# Patient Record
Sex: Male | Born: 1935
Health system: Southern US, Community
[De-identification: ages and names within clinical notes are randomized; demographics above are authoritative.]

## PROBLEM LIST (undated history)

## (undated) DIAGNOSIS — I10 Essential (primary) hypertension: Secondary | ICD-10-CM

## (undated) DIAGNOSIS — K5790 Diverticulosis of intestine, part unspecified, without perforation or abscess without bleeding: Secondary | ICD-10-CM

## (undated) DIAGNOSIS — K219 Gastro-esophageal reflux disease without esophagitis: Secondary | ICD-10-CM

## (undated) DIAGNOSIS — E785 Hyperlipidemia, unspecified: Secondary | ICD-10-CM

## (undated) DIAGNOSIS — K635 Polyp of colon: Secondary | ICD-10-CM

## (undated) DIAGNOSIS — M549 Dorsalgia, unspecified: Secondary | ICD-10-CM

## (undated) DIAGNOSIS — G8929 Other chronic pain: Secondary | ICD-10-CM

## (undated) DIAGNOSIS — R079 Chest pain, unspecified: Secondary | ICD-10-CM

## (undated) DIAGNOSIS — IMO0002 Reserved for concepts with insufficient information to code with codable children: Secondary | ICD-10-CM

## (undated) DIAGNOSIS — N529 Male erectile dysfunction, unspecified: Secondary | ICD-10-CM

## (undated) HISTORY — DX: Essential (primary) hypertension: I10

## (undated) HISTORY — DX: Chest pain, unspecified: R07.9

## (undated) HISTORY — DX: Dorsalgia, unspecified: M54.9

## (undated) HISTORY — DX: Hyperlipidemia, unspecified: E78.5

## (undated) HISTORY — DX: Male erectile dysfunction, unspecified: N52.9

## (undated) HISTORY — DX: Reserved for concepts with insufficient information to code with codable children: IMO0002

## (undated) HISTORY — DX: Polyp of colon: K63.5

## (undated) HISTORY — DX: Diverticulosis of intestine, part unspecified, without perforation or abscess without bleeding: K57.90

## (undated) HISTORY — DX: Gastro-esophageal reflux disease without esophagitis: K21.9

## (undated) HISTORY — DX: Other chronic pain: G89.29

---

## 1998-01-20 ENCOUNTER — Ambulatory Visit (HOSPITAL_COMMUNITY): Admission: RE | Admit: 1998-01-20 | Discharge: 1998-01-20 | Payer: Self-pay | Admitting: Internal Medicine

## 2002-01-27 ENCOUNTER — Encounter: Payer: Self-pay | Admitting: *Deleted

## 2002-01-27 ENCOUNTER — Emergency Department (HOSPITAL_COMMUNITY): Admission: EM | Admit: 2002-01-27 | Discharge: 2002-01-27 | Payer: Self-pay | Admitting: *Deleted

## 2002-04-02 ENCOUNTER — Ambulatory Visit (HOSPITAL_COMMUNITY): Admission: RE | Admit: 2002-04-02 | Discharge: 2002-04-02 | Payer: Self-pay | Admitting: Gastroenterology

## 2002-05-14 ENCOUNTER — Encounter: Payer: Self-pay | Admitting: Gastroenterology

## 2002-05-14 ENCOUNTER — Ambulatory Visit (HOSPITAL_COMMUNITY): Admission: RE | Admit: 2002-05-14 | Discharge: 2002-05-14 | Payer: Self-pay | Admitting: Gastroenterology

## 2012-02-21 DIAGNOSIS — I10 Essential (primary) hypertension: Secondary | ICD-10-CM | POA: Diagnosis not present

## 2012-02-21 DIAGNOSIS — R011 Cardiac murmur, unspecified: Secondary | ICD-10-CM | POA: Diagnosis not present

## 2012-05-16 ENCOUNTER — Other Ambulatory Visit: Payer: Self-pay | Admitting: Gastroenterology

## 2012-05-16 DIAGNOSIS — D126 Benign neoplasm of colon, unspecified: Secondary | ICD-10-CM | POA: Diagnosis not present

## 2012-07-17 DIAGNOSIS — Z23 Encounter for immunization: Secondary | ICD-10-CM | POA: Diagnosis not present

## 2012-08-21 DIAGNOSIS — Z1331 Encounter for screening for depression: Secondary | ICD-10-CM | POA: Diagnosis not present

## 2012-08-21 DIAGNOSIS — E785 Hyperlipidemia, unspecified: Secondary | ICD-10-CM | POA: Diagnosis not present

## 2012-08-21 DIAGNOSIS — I10 Essential (primary) hypertension: Secondary | ICD-10-CM | POA: Diagnosis not present

## 2012-08-21 DIAGNOSIS — Z0000 Encounter for general adult medical examination without abnormal findings: Secondary | ICD-10-CM | POA: Diagnosis not present

## 2013-02-19 DIAGNOSIS — I10 Essential (primary) hypertension: Secondary | ICD-10-CM | POA: Diagnosis not present

## 2013-07-10 DIAGNOSIS — Z23 Encounter for immunization: Secondary | ICD-10-CM | POA: Diagnosis not present

## 2013-09-03 DIAGNOSIS — Z0000 Encounter for general adult medical examination without abnormal findings: Secondary | ICD-10-CM | POA: Diagnosis not present

## 2013-09-03 DIAGNOSIS — E785 Hyperlipidemia, unspecified: Secondary | ICD-10-CM | POA: Diagnosis not present

## 2013-09-03 DIAGNOSIS — Z1331 Encounter for screening for depression: Secondary | ICD-10-CM | POA: Diagnosis not present

## 2013-09-03 DIAGNOSIS — I10 Essential (primary) hypertension: Secondary | ICD-10-CM | POA: Diagnosis not present

## 2014-03-04 DIAGNOSIS — N529 Male erectile dysfunction, unspecified: Secondary | ICD-10-CM | POA: Diagnosis not present

## 2014-03-04 DIAGNOSIS — Z23 Encounter for immunization: Secondary | ICD-10-CM | POA: Diagnosis not present

## 2014-03-04 DIAGNOSIS — I10 Essential (primary) hypertension: Secondary | ICD-10-CM | POA: Diagnosis not present

## 2014-07-03 DIAGNOSIS — M25559 Pain in unspecified hip: Secondary | ICD-10-CM | POA: Diagnosis not present

## 2014-07-03 DIAGNOSIS — Z23 Encounter for immunization: Secondary | ICD-10-CM | POA: Diagnosis not present

## 2014-07-08 ENCOUNTER — Other Ambulatory Visit: Payer: Self-pay | Admitting: Internal Medicine

## 2014-07-08 ENCOUNTER — Ambulatory Visit
Admission: RE | Admit: 2014-07-08 | Discharge: 2014-07-08 | Disposition: A | Discharge status: Home / Self Care | Payer: Medicare Other | Source: Ambulatory Visit | Attending: Internal Medicine | Admitting: Internal Medicine

## 2014-07-08 DIAGNOSIS — M25551 Pain in right hip: Secondary | ICD-10-CM

## 2014-07-08 DIAGNOSIS — M25559 Pain in unspecified hip: Secondary | ICD-10-CM | POA: Diagnosis not present

## 2014-07-10 DIAGNOSIS — M25559 Pain in unspecified hip: Secondary | ICD-10-CM | POA: Diagnosis not present

## 2014-07-15 DIAGNOSIS — M25559 Pain in unspecified hip: Secondary | ICD-10-CM | POA: Diagnosis not present

## 2014-07-17 DIAGNOSIS — M545 Low back pain, unspecified: Secondary | ICD-10-CM | POA: Diagnosis not present

## 2014-07-19 DIAGNOSIS — M545 Low back pain: Secondary | ICD-10-CM | POA: Diagnosis not present

## 2014-07-24 DIAGNOSIS — M545 Low back pain: Secondary | ICD-10-CM | POA: Diagnosis not present

## 2014-07-24 DIAGNOSIS — M5126 Other intervertebral disc displacement, lumbar region: Secondary | ICD-10-CM | POA: Diagnosis not present

## 2014-07-24 DIAGNOSIS — M5416 Radiculopathy, lumbar region: Secondary | ICD-10-CM | POA: Diagnosis not present

## 2014-08-08 DIAGNOSIS — M5126 Other intervertebral disc displacement, lumbar region: Secondary | ICD-10-CM | POA: Diagnosis not present

## 2014-08-08 DIAGNOSIS — M5416 Radiculopathy, lumbar region: Secondary | ICD-10-CM | POA: Diagnosis not present

## 2014-08-08 DIAGNOSIS — I10 Essential (primary) hypertension: Secondary | ICD-10-CM | POA: Diagnosis not present

## 2014-08-12 DIAGNOSIS — M5116 Intervertebral disc disorders with radiculopathy, lumbar region: Secondary | ICD-10-CM | POA: Diagnosis not present

## 2014-08-12 DIAGNOSIS — M5126 Other intervertebral disc displacement, lumbar region: Secondary | ICD-10-CM | POA: Diagnosis not present

## 2014-09-23 DIAGNOSIS — E538 Deficiency of other specified B group vitamins: Secondary | ICD-10-CM | POA: Diagnosis not present

## 2014-09-23 DIAGNOSIS — Z0000 Encounter for general adult medical examination without abnormal findings: Secondary | ICD-10-CM | POA: Diagnosis not present

## 2014-09-23 DIAGNOSIS — I10 Essential (primary) hypertension: Secondary | ICD-10-CM | POA: Diagnosis not present

## 2014-09-23 DIAGNOSIS — Z1389 Encounter for screening for other disorder: Secondary | ICD-10-CM | POA: Diagnosis not present

## 2014-09-23 DIAGNOSIS — E780 Pure hypercholesterolemia: Secondary | ICD-10-CM | POA: Diagnosis not present

## 2014-11-13 IMAGING — CR DG LUMBAR SPINE COMPLETE 4+V
4 series · 4 of 4 positions shown · non-contrast
Comparison: None.

CLINICAL DATA: Acute low back pain radiating into right hip. No
known injury/trauma. Remote history of low back surgery.

EXAM:
LUMBAR SPINE - COMPLETE 4+ VIEW

[t l-spine a.p.]
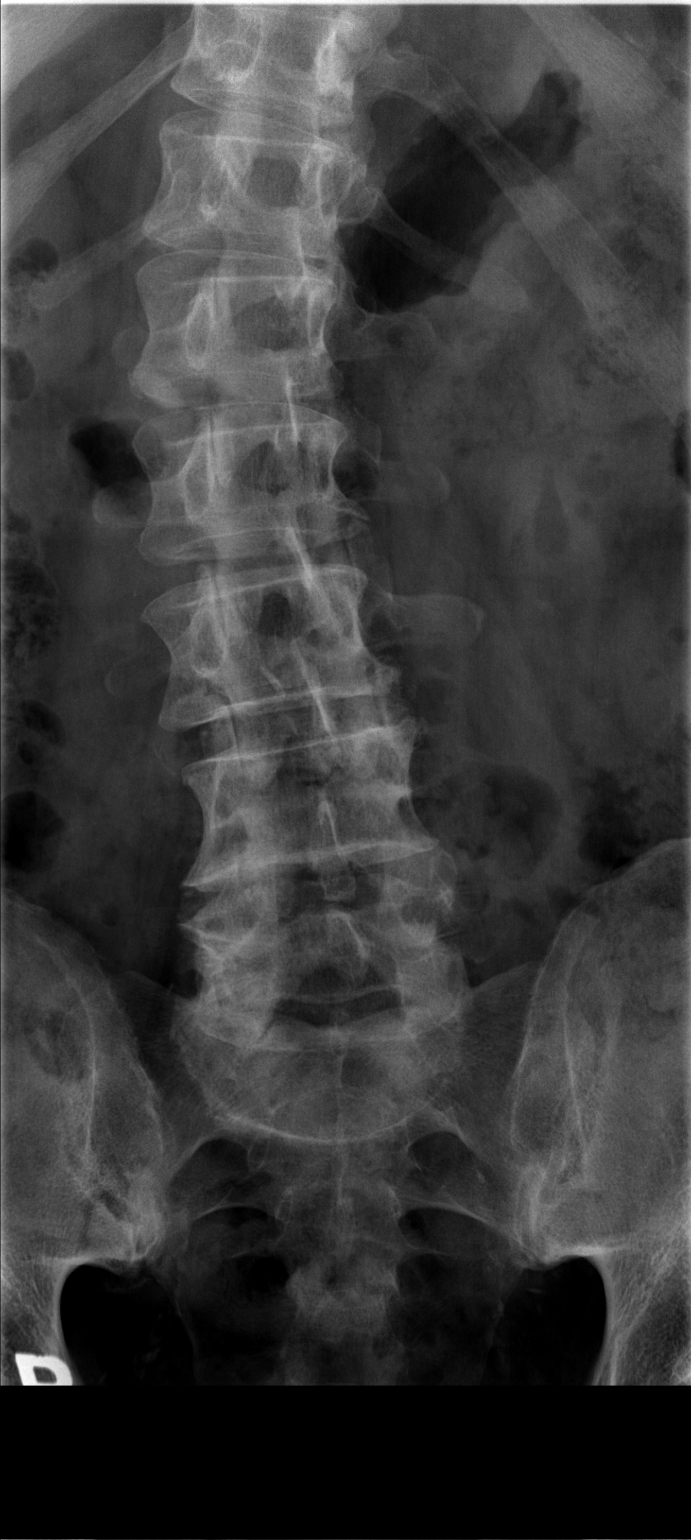

[t l-spine oblique exposure (1 of 2)]
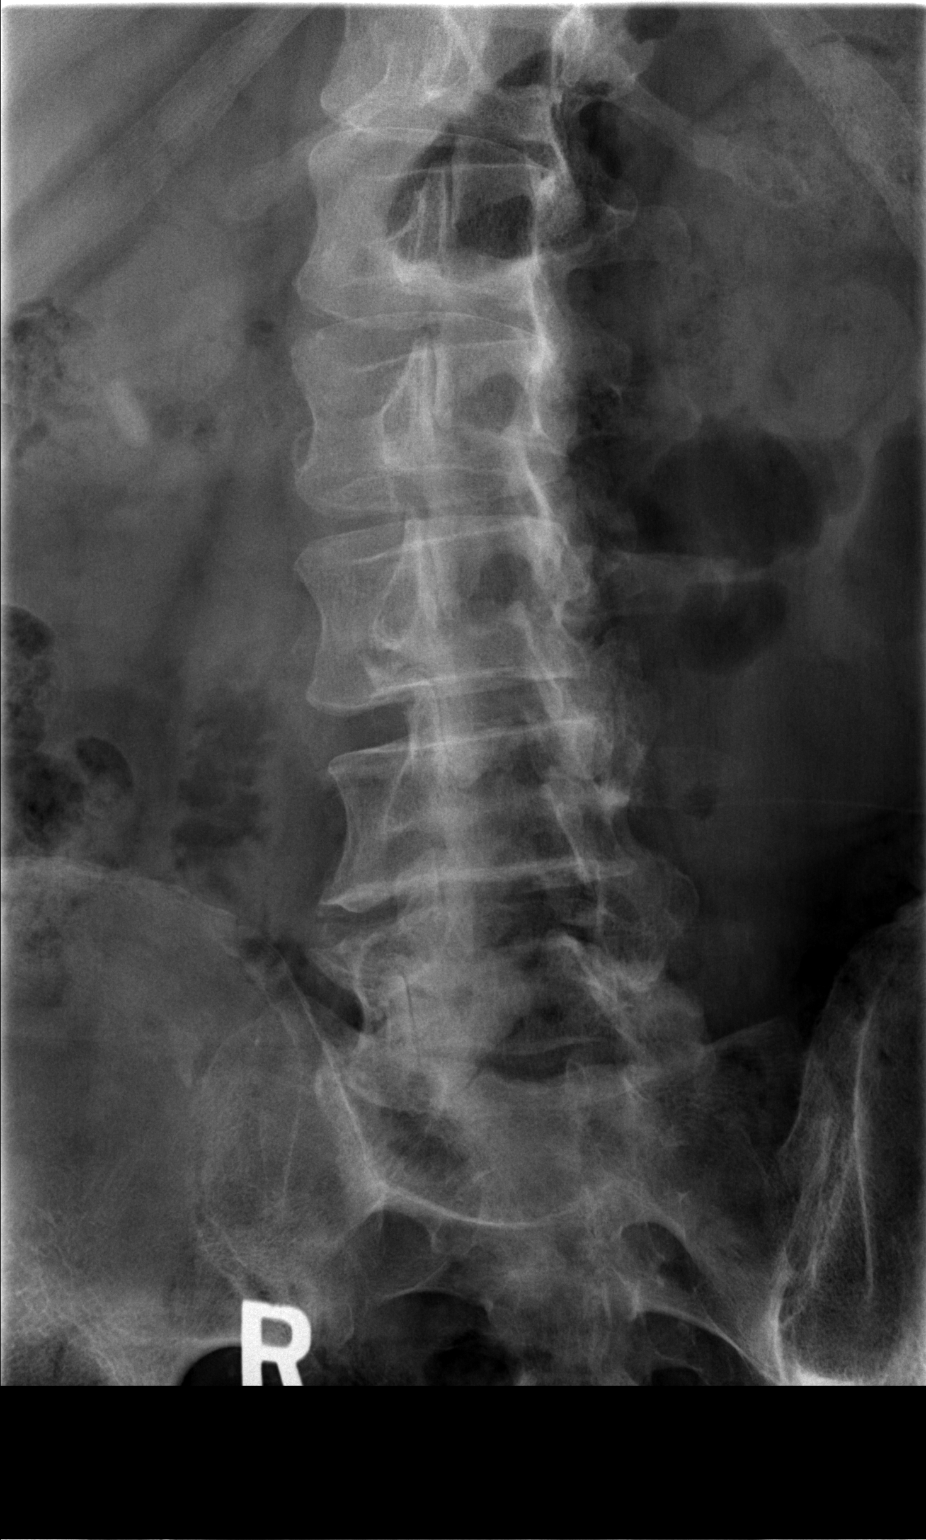

[t l-spine oblique exposure (2 of 2)]
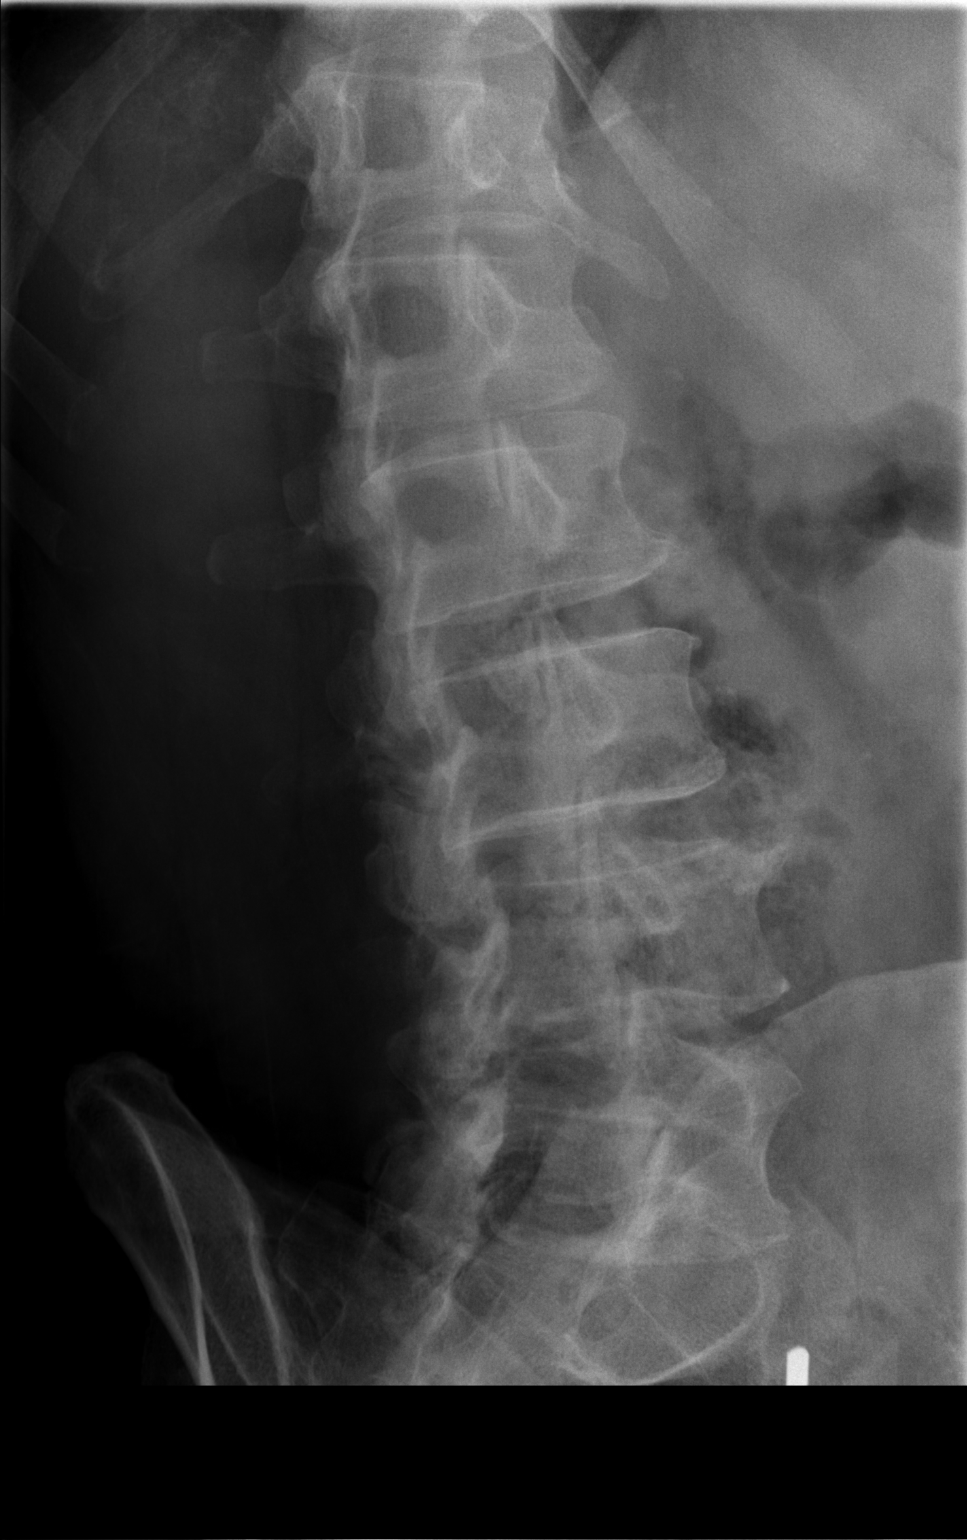

[t l-spine l5-s1 spot]
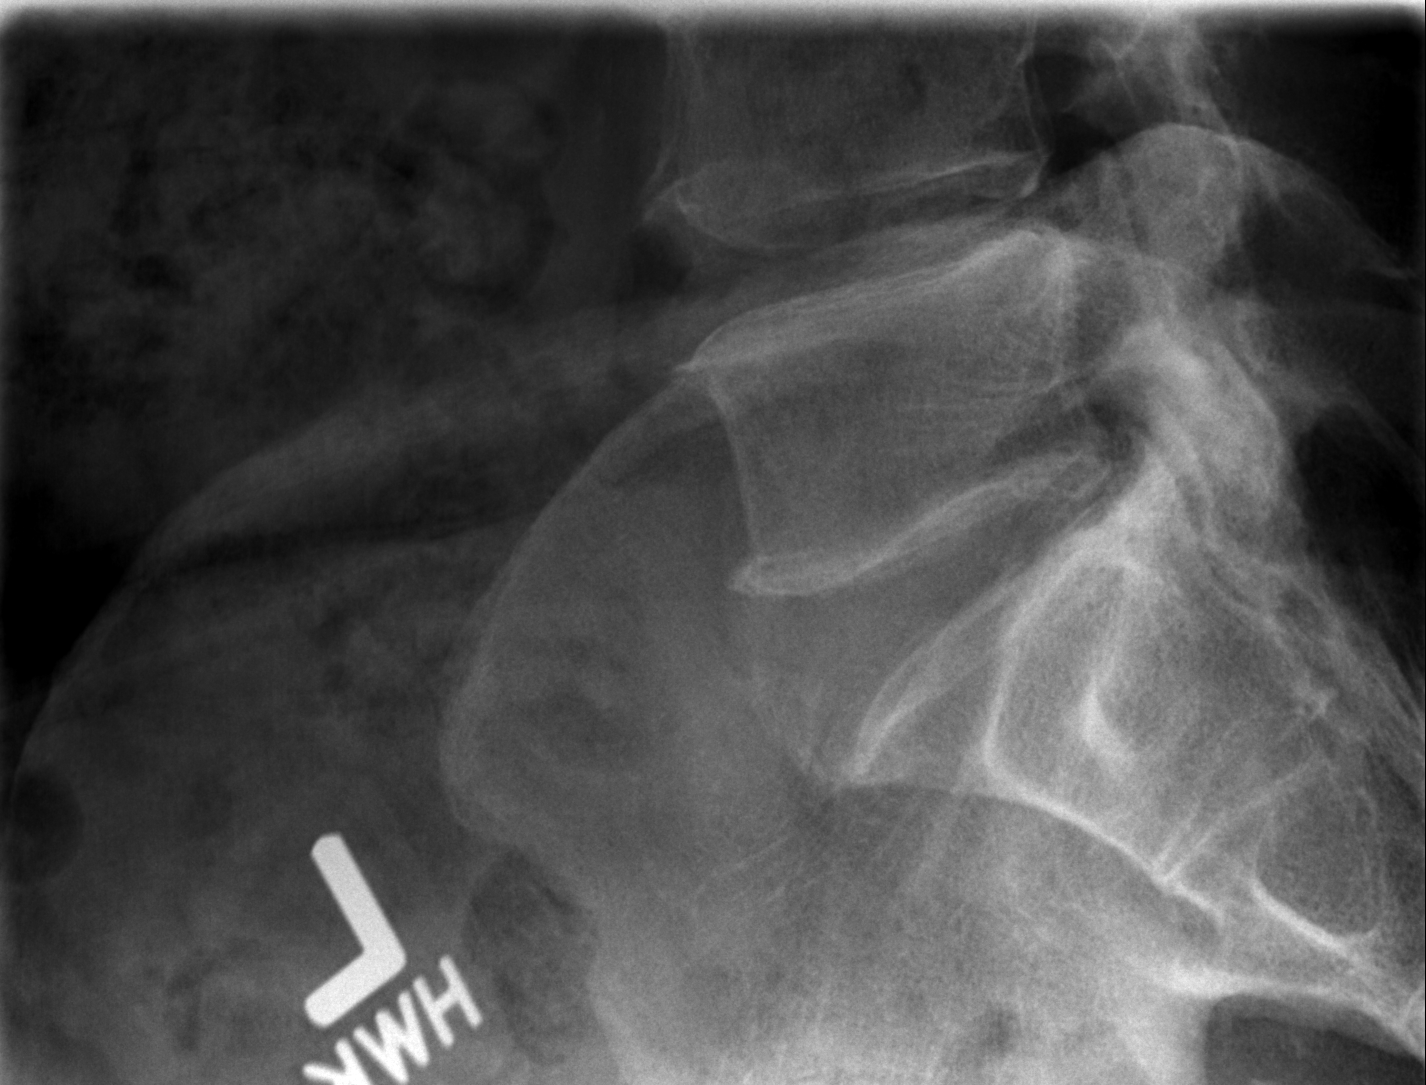

[4 of 4 positions shown; findings below may reference images not displayed]

FINDINGS: Mild rightward scoliosis in the lumbar spine. Degenerative facet
disease in the mid and lower lumbar spine. Degenerative disc disease
from L3-4 through L5-S1 with disc space narrowing and anterior
spurring. No fracture or acute subluxation. SI joints are symmetric
and unremarkable.
IMPRESSION: Rightward scoliosis.

Degenerative disc and facet disease in the mid and lower lumbar
spine.

## 2015-03-31 DIAGNOSIS — E780 Pure hypercholesterolemia: Secondary | ICD-10-CM | POA: Diagnosis not present

## 2015-03-31 DIAGNOSIS — I10 Essential (primary) hypertension: Secondary | ICD-10-CM | POA: Diagnosis not present

## 2015-07-25 DIAGNOSIS — Z23 Encounter for immunization: Secondary | ICD-10-CM | POA: Diagnosis not present

## 2015-09-24 DIAGNOSIS — R112 Nausea with vomiting, unspecified: Secondary | ICD-10-CM | POA: Diagnosis not present

## 2015-09-24 DIAGNOSIS — I10 Essential (primary) hypertension: Secondary | ICD-10-CM | POA: Diagnosis not present

## 2015-09-24 DIAGNOSIS — M6281 Muscle weakness (generalized): Secondary | ICD-10-CM | POA: Diagnosis not present

## 2015-09-24 DIAGNOSIS — R42 Dizziness and giddiness: Secondary | ICD-10-CM | POA: Diagnosis not present

## 2015-09-24 DIAGNOSIS — R001 Bradycardia, unspecified: Secondary | ICD-10-CM | POA: Diagnosis not present

## 2016-01-19 DIAGNOSIS — R351 Nocturia: Secondary | ICD-10-CM | POA: Diagnosis not present

## 2016-01-19 DIAGNOSIS — I10 Essential (primary) hypertension: Secondary | ICD-10-CM | POA: Diagnosis not present

## 2016-01-19 DIAGNOSIS — R350 Frequency of micturition: Secondary | ICD-10-CM | POA: Diagnosis not present

## 2016-03-25 ENCOUNTER — Encounter (HOSPITAL_COMMUNITY): Payer: Self-pay | Admitting: Emergency Medicine

## 2016-03-25 ENCOUNTER — Emergency Department (HOSPITAL_COMMUNITY): Payer: Medicare Other

## 2016-03-25 ENCOUNTER — Emergency Department (HOSPITAL_COMMUNITY)
Admission: EM | Admit: 2016-03-25 | Discharge: 2016-03-25 | Disposition: A | Discharge status: Home / Self Care | Payer: Medicare Other | Attending: Emergency Medicine | Admitting: Emergency Medicine

## 2016-03-25 DIAGNOSIS — R0789 Other chest pain: Secondary | ICD-10-CM | POA: Diagnosis not present

## 2016-03-25 DIAGNOSIS — I10 Essential (primary) hypertension: Secondary | ICD-10-CM | POA: Insufficient documentation

## 2016-03-25 DIAGNOSIS — Z7982 Long term (current) use of aspirin: Secondary | ICD-10-CM | POA: Insufficient documentation

## 2016-03-25 DIAGNOSIS — E785 Hyperlipidemia, unspecified: Secondary | ICD-10-CM | POA: Diagnosis not present

## 2016-03-25 DIAGNOSIS — Z87891 Personal history of nicotine dependence: Secondary | ICD-10-CM | POA: Diagnosis not present

## 2016-03-25 DIAGNOSIS — Z79899 Other long term (current) drug therapy: Secondary | ICD-10-CM | POA: Insufficient documentation

## 2016-03-25 DIAGNOSIS — R079 Chest pain, unspecified: Secondary | ICD-10-CM | POA: Diagnosis not present

## 2016-03-25 DIAGNOSIS — Z711 Person with feared health complaint in whom no diagnosis is made: Secondary | ICD-10-CM | POA: Insufficient documentation

## 2016-03-25 LAB — I-STAT TROPONIN, ED
Troponin i, poc: 0 ng/mL (ref 0.00–0.08)
Troponin i, poc: 0 ng/mL (ref 0.00–0.08)

## 2016-03-25 LAB — BASIC METABOLIC PANEL
Anion gap: 5 (ref 5–15)
BUN: 9 mg/dL (ref 6–20)
CHLORIDE: 103 mmol/L (ref 101–111)
CO2: 30 mmol/L (ref 22–32)
Calcium: 9.1 mg/dL (ref 8.9–10.3)
Creatinine, Ser: 0.78 mg/dL (ref 0.61–1.24)
GFR calc Af Amer: >60 mL/min (ref >60)
GFR calc non Af Amer: >60 mL/min (ref >60)
GLUCOSE: 107 mg/dL — AB (ref 65–99)
POTASSIUM: 3.6 mmol/L (ref 3.5–5.1)
Sodium: 138 mmol/L (ref 135–145)

## 2016-03-25 LAB — CBC
HEMATOCRIT: 35.8 % — AB (ref 39.0–52.0)
HEMOGLOBIN: 12.4 g/dL — AB (ref 13.0–17.0)
MCH: 31.5 pg (ref 26.0–34.0)
MCHC: 34.6 g/dL (ref 30.0–36.0)
MCV: 90.9 fL (ref 78.0–100.0)
Platelets: 148 10*3/uL — ABNORMAL LOW (ref 150–400)
RBC: 3.94 MIL/uL — ABNORMAL LOW (ref 4.22–5.81)
RDW: 13.6 % (ref 11.5–15.5)
WBC: 3 10*3/uL — ABNORMAL LOW (ref 4.0–10.5)

## 2016-03-25 LAB — URINALYSIS, ROUTINE W REFLEX MICROSCOPIC
Bilirubin Urine: NEGATIVE
GLUCOSE, UA: NEGATIVE mg/dL
HGB URINE DIPSTICK: NEGATIVE
Ketones, ur: NEGATIVE mg/dL
LEUKOCYTES UA: NEGATIVE
Nitrite: NEGATIVE
Protein, ur: NEGATIVE mg/dL
SPECIFIC GRAVITY, URINE: 1.011 (ref 1.005–1.030)
pH: 6.5 (ref 5.0–8.0)

## 2016-03-25 NOTE — Discharge Instructions (Signed)
You were seen in the ED for an episode of feeling abnormal that happened at rest last night.  Your cardiac labs, urine, chest xray are normal.  I do not feel there is any life threatening process is present. It is not clear what caused you to have this feeling for several seconds last night.  I recommend close follow up with your PCP.   If you have chest pain, palpitations, shortness of breath, dizziness, numbness or weakness on one side of your body, severe headache, difficulty with speech or slurred speech, please return to the ED.

## 2016-03-25 NOTE — ED Notes (Signed)
Pt brought to ED by [facility] from home for c/o chest discomfort pt states he woke up with a chest pressure that last for few minutes and he will like to be check up. Pt got 324 mg ASA by [facility] denies any cp, nausea or vomiting at this time. VS WNL BP 158/64, HR 52, R24, SPO2 98% RA.

## 2016-03-25 NOTE — ED Provider Notes (Addendum)
TIME SEEN: 4:50 AM  CHIEF COMPLAINT: "I felt funny"  HPI: Pt is a 82 y.o. male with history of hypertension, hyperlipidemia who presents to the emergency department with EMS for an episode of "feeling funny" and "feeling out of it" it occurred at 3:30 AM and lasted for 1-2 seconds. States that this feeling woke him from sleep. He denies to me that he had any chest pain or chest discomfort, shortness of breath. Denies nausea, vomiting or diarrhea. No recent bloody stools, melena. No numbness, tingling or focal weakness. No headache. States he felt normal prior to this episode and feels completely normal male. Never had anything similar.  ROS: See HPI Constitutional: no fever  Eyes: no drainage  ENT: no runny nose   Cardiovascular:  no chest pain  Resp: no SOB  GI: no vomiting GU: no dysuria Integumentary: no rash  Allergy: no hives  Musculoskeletal: no leg swelling  Neurological: no slurred speech ROS otherwise negative  PAST MEDICAL HISTORY/PAST SURGICAL HISTORY:  Past Medical History  Diagnosis Date  . Hypertension   . Hyperlipidemia   . ED (erectile dysfunction)   . Chest pain     neg nuclear stress [id number] and 2002  . Chronic back pain   . DDD (degenerative disc disease)   . GERD (gastroesophageal reflux disease)   . Diverticulosis   . Colon polyps     MEDICATIONS:  Prior to Admission medications   Medication Sig Start Date End Date Taking? Authorizing Provider  aspirin 81 MG tablet Take 81 mg by mouth daily.    Historical Provider, MD  etodolac (LODINE) 400 MG tablet Take 400 mg by mouth 2 (two) times daily.    Historical Provider, MD  lisinopril-hydrochlorothiazide (PRINZIDE,ZESTORETIC) 20-25 MG per tablet Take 1 tablet by mouth daily.    Historical Provider, MD  Multiple Vitamin (MULTIVITAMIN) tablet Take 1 tablet by mouth daily.    Historical Provider, MD  simvastatin (ZOCOR) 20 MG tablet Take 20 mg by mouth daily.    Historical Provider, MD  vitamin B-12  (CYANOCOBALAMIN) 100 MCG tablet Take 100 mcg by mouth daily.    Historical Provider, MD    ALLERGIES:  No Known Allergies  SOCIAL HISTORY:  Social History  Substance Use Topics  . Smoking status: Former Research scientist (life sciences)  . Smokeless tobacco: Not on file  . Alcohol Use: Not on file    FAMILY HISTORY: No family history on file.  EXAM: BP 158/85 mmHg  Pulse 56  Temp(Src) 98.1 F (36.7 C) (Oral)  Resp 18  Ht 6\' 1"  (1.854 m)  Wt 190 lb (86.183 kg)  BMI 25.07 kg/m2  SpO2 98% CONSTITUTIONAL: Alert and oriented and responds appropriately to questions. Well-appearing; well-nourished, Smiling, appears younger than stated age, in no distress HEAD: Normocephalic EYES: Conjunctivae clear, PERRL ENT: normal nose; no rhinorrhea; moist mucous membranes NECK: Supple, no meningismus, no LAD  CARD: RRR; S1 and S2 appreciated; no murmurs, no clicks, no rubs, no gallops RESP: Normal chest excursion without splinting or tachypnea; breath sounds clear and equal bilaterally; no wheezes, no rhonchi, no rales, no hypoxia or respiratory distress, speaking full sentences ABD/GI: Normal bowel sounds; non-distended; soft, non-tender, no rebound, no guarding, no peritoneal signs BACK:  The back appears normal and is non-tender to palpation, there is no CVA tenderness EXT: Normal ROM in all joints; non-tender to palpation; no edema; normal capillary refill; no cyanosis, no calf tenderness or swelling    SKIN: Normal color for age and race; warm; no rash NEURO:  Moves all extremities equally, sensation to light touch intact diffusely, cranial nerves II through XII intact, ambulates with steady gait without assistance PSYCH: The patient's mood and manner are appropriate. Grooming and personal hygiene are appropriate.  MEDICAL DECISION MAKING: Patient here with an episode where he states he felt "out of it". Denies any other associated symptoms. Episode only lasted 1-2 seconds. Currently asymptomatic. Neurologically  intact. Denies any chest pain or shortness of breath. EKG shows no ischemic abnormality. We'll continue to monitor patient on cardiac monitor. He has no known history of arrhythmia. Denies any palpitations. Will obtain cardiac labs, chest x-ray, urine.  ED PROGRESS: 5:00 AM  Patient's labs unremarkable. Troponin negative. Chest x-ray clear.  Will obtain second troponin at 7:30 AM. Have offered patient admission for monitoring given he does have risk factors for ACS and to monitor him for arrhythmia but he declines. Doubt stroke, TIA given no neurologic deficits. Doubt intracranial hemorrhage. No recent infectious symptoms.    7:17 AM  Pt still reports feeling well and adamantly denies any chest pain or chest discomfort.  Again denies admission.  UA and second troponin pending.  If troponin negative, will dc home with outpatient follow up.  Discussed return precautions.  He is comfortable with this plan.  Has appt to see his PCP on June 13th.   I reviewed all nursing notes, vitals, pertinent old records, EKGs, labs, imaging (as available).    EKG Interpretation  Date/Time:  Thursday March 25 2016 03:59:28 EDT Ventricular Rate:  57 PR Interval:  173 QRS Duration: 79 QT Interval:  414 QTC Calculation: 403 R Axis:   34 Text Interpretation:  Sinus rhythm Probable left atrial enlargement Low voltage, precordial leads Anteroseptal infarct, age indeterminate No old tracing to compare Confirmed by WARD,  DO, KRISTEN [phone]) on 03/25/2016 4:03:05 AM        [place], DO 03/25/16 [place], DO 03/25/16 [id number]

## 2016-03-30 DIAGNOSIS — H6122 Impacted cerumen, left ear: Secondary | ICD-10-CM | POA: Diagnosis not present

## 2016-03-30 DIAGNOSIS — I10 Essential (primary) hypertension: Secondary | ICD-10-CM | POA: Diagnosis not present

## 2016-03-30 DIAGNOSIS — Z125 Encounter for screening for malignant neoplasm of prostate: Secondary | ICD-10-CM | POA: Diagnosis not present

## 2016-06-14 ENCOUNTER — Emergency Department (HOSPITAL_COMMUNITY)
Admission: EM | Admit: 2016-06-14 | Discharge: 2016-06-14 | Disposition: A | Discharge status: Home / Self Care | Payer: Medicare Other | Attending: Emergency Medicine | Admitting: Emergency Medicine

## 2016-06-14 ENCOUNTER — Emergency Department (HOSPITAL_COMMUNITY): Payer: Medicare Other

## 2016-06-14 ENCOUNTER — Encounter (HOSPITAL_COMMUNITY): Payer: Self-pay

## 2016-06-14 DIAGNOSIS — M546 Pain in thoracic spine: Secondary | ICD-10-CM

## 2016-06-14 DIAGNOSIS — S299XXA Unspecified injury of thorax, initial encounter: Secondary | ICD-10-CM | POA: Diagnosis not present

## 2016-06-14 DIAGNOSIS — S0990XA Unspecified injury of head, initial encounter: Secondary | ICD-10-CM | POA: Diagnosis not present

## 2016-06-14 DIAGNOSIS — Z79899 Other long term (current) drug therapy: Secondary | ICD-10-CM | POA: Diagnosis not present

## 2016-06-14 DIAGNOSIS — W11XXXA Fall on and from ladder, initial encounter: Secondary | ICD-10-CM | POA: Insufficient documentation

## 2016-06-14 DIAGNOSIS — Z87891 Personal history of nicotine dependence: Secondary | ICD-10-CM | POA: Diagnosis not present

## 2016-06-14 DIAGNOSIS — I6782 Cerebral ischemia: Secondary | ICD-10-CM | POA: Insufficient documentation

## 2016-06-14 DIAGNOSIS — Z7982 Long term (current) use of aspirin: Secondary | ICD-10-CM | POA: Diagnosis not present

## 2016-06-14 DIAGNOSIS — Y9389 Activity, other specified: Secondary | ICD-10-CM | POA: Diagnosis not present

## 2016-06-14 DIAGNOSIS — S40812A Abrasion of left upper arm, initial encounter: Secondary | ICD-10-CM | POA: Diagnosis not present

## 2016-06-14 DIAGNOSIS — Y990 Civilian activity done for income or pay: Secondary | ICD-10-CM | POA: Diagnosis not present

## 2016-06-14 DIAGNOSIS — S4992XA Unspecified injury of left shoulder and upper arm, initial encounter: Secondary | ICD-10-CM | POA: Diagnosis present

## 2016-06-14 DIAGNOSIS — Y929 Unspecified place or not applicable: Secondary | ICD-10-CM | POA: Insufficient documentation

## 2016-06-14 DIAGNOSIS — M545 Low back pain, unspecified: Secondary | ICD-10-CM

## 2016-06-14 DIAGNOSIS — S3992XA Unspecified injury of lower back, initial encounter: Secondary | ICD-10-CM | POA: Diagnosis not present

## 2016-06-14 DIAGNOSIS — R031 Nonspecific low blood-pressure reading: Secondary | ICD-10-CM | POA: Diagnosis not present

## 2016-06-14 DIAGNOSIS — M542 Cervicalgia: Secondary | ICD-10-CM

## 2016-06-14 DIAGNOSIS — W19XXXA Unspecified fall, initial encounter: Secondary | ICD-10-CM

## 2016-06-14 DIAGNOSIS — Z23 Encounter for immunization: Secondary | ICD-10-CM | POA: Insufficient documentation

## 2016-06-14 DIAGNOSIS — I10 Essential (primary) hypertension: Secondary | ICD-10-CM | POA: Insufficient documentation

## 2016-06-14 DIAGNOSIS — S199XXA Unspecified injury of neck, initial encounter: Secondary | ICD-10-CM | POA: Diagnosis not present

## 2016-06-14 MED ORDER — TETANUS-DIPHTH-ACELL PERTUSSIS 5-2.5-18.5 LF-MCG/0.5 IM SUSP
0.5000 mL | Freq: Once | INTRAMUSCULAR | Status: AC
  Administered 2016-06-14: 0.5 mL via INTRAMUSCULAR
  Filled 2016-06-14: qty 0.5

## 2016-06-14 MED ORDER — NAPROXEN 250 MG PO TABS: 250.0000 mg | ORAL_TABLET | Freq: Two times a day (BID) | ORAL | 0 refills | Status: DC

## 2016-06-14 MED ORDER — ACETAMINOPHEN 325 MG PO TABS: 650.0000 mg | ORAL_TABLET | Freq: Four times a day (QID) | ORAL | 0 refills | Status: DC | PRN

## 2016-06-14 MED ORDER — ACETAMINOPHEN 325 MG PO TABS
650.0000 mg | ORAL_TABLET | Freq: Once | ORAL | Status: AC
  Administered 2016-06-14: 650 mg via ORAL
  Filled 2016-06-14: qty 2

## 2016-06-14 NOTE — ED Notes (Signed)
Bed: [facility] Expected date:  Expected time:  Means of arrival:  Comments: No bed. 

## 2016-06-14 NOTE — ED Triage Notes (Signed)
Patient presents from home via ems for fall. Patient was working on roof and fell approximately 6-7 feet from ladder. Abrasion to left forearm, c/o neck pain, towel around neck. A&O x4.   20g L forearm  Last VS: 143/85, 58hr, 97%ra, 14resp, 149cbg

## 2016-06-14 NOTE — ED Provider Notes (Signed)
[facility] DEPT Provider Note   CSN: [id number] Arrival date & time: 06/14/16  1313     History   Chief Complaint Chief Complaint  Patient presents with  . Fall    HPI Cody Contreras is a 82 y.o. male.  Cody Contreras is a 82 y.o. male who presents the emergency department after a fall today. Patient reports he was using a ladder at his house in the back of his pickup truck. He reports he was coming down from the roof when the ladder slipped on the truck and he fell to the ground.He thinks he was about 5-6 feet up.  He reports landing on his left side. He complains of neck pain and mid and low back pain.  He denies hitting his head or loss of consciousness. He has an abrasion to his left forearm but denies any pain to his extremities. No treatments prior to arrival. Patient denies fevers, headache, double vision, numbness, tingling, weakness, urinary symptoms, loss of bladder control, loss of bowel control, abdominal pain, nausea, vomiting. Last Tdap is unknown.    The history is provided by the patient. No language interpreter was used.  Fall  Pertinent negatives include no chest pain, no abdominal pain, no headaches and no shortness of breath.    Past Medical History:  Diagnosis Date  . Chest pain    neg nuclear stress [phone] and 2002  . Chronic back pain   . Colon polyps   . DDD (degenerative disc disease)   . Diverticulosis   . ED (erectile dysfunction)   . GERD (gastroesophageal reflux disease)   . Hyperlipidemia   . Hypertension     There are no active problems to display for this patient.   History reviewed. No pertinent surgical history.     Home Medications    Prior to Admission medications   Medication Sig Start Date End Date Taking? Authorizing Provider  aspirin 81 MG tablet Take 81 mg by mouth daily.   Yes Historical Provider, MD  lisinopril-hydrochlorothiazide (PRINZIDE,ZESTORETIC) 20-25 MG per tablet Take 1 tablet by mouth daily.   Yes  Historical Provider, MD  Multiple Vitamin (MULTIVITAMIN) tablet Take 1 tablet by mouth daily.   Yes Historical Provider, MD  simvastatin (ZOCOR) 20 MG tablet Take 20 mg by mouth daily.   Yes Historical Provider, MD  vitamin B-12 (CYANOCOBALAMIN) 100 MCG tablet Take 100 mcg by mouth daily.   Yes Historical Provider, MD  acetaminophen (TYLENOL) 325 MG tablet Take 2 tablets (650 mg total) by mouth every 6 (six) hours as needed for mild pain or moderate pain. 06/14/16   Waynetta Pean, PA-C  naproxen (NAPROSYN) 250 MG tablet Take 1 tablet (250 mg total) by mouth 2 (two) times daily with a meal. 06/14/16   Waynetta Pean, PA-C    Family History History reviewed. No pertinent family history.  Social History Social History  Substance Use Topics  . Smoking status: Former Smoker    Types: Cigarettes  . Smokeless tobacco: Not on file  . Alcohol use No     Allergies   Review of patient's allergies indicates no known allergies.   Review of Systems Review of Systems  Constitutional: Negative for chills and fever.  HENT: Negative for congestion, nosebleeds and sore throat.   Eyes: Negative for visual disturbance.  Respiratory: Negative for cough and shortness of breath.   Cardiovascular: Negative for chest pain.  Gastrointestinal: Negative for abdominal pain, diarrhea, nausea and vomiting.  Genitourinary: Negative for difficulty urinating  and dysuria.  Musculoskeletal: Positive for back pain and neck pain.  Skin: Positive for wound. Negative for rash.  Neurological: Negative for dizziness, weakness, numbness and headaches.     Physical Exam Updated Vital Signs BP 146/83   Pulse 60   Temp 98 F (36.7 C)   Resp 17   Ht 6' (1.829 m)   Wt 86.2 kg   SpO2 100%   BMI 25.77 kg/m   Physical Exam  Constitutional: He is oriented to person, place, and time. He appears well-developed and well-nourished. No distress.  Nontoxic appearing.  HENT:  Head: Normocephalic and atraumatic.  Right  Ear: External ear normal.  Left Ear: External ear normal.  Mouth/Throat: Oropharynx is clear and moist.  No visible signs of head trauma. Bilateral tympanic membranes are pearly-gray without erythema or loss of landmarks.   Eyes: Conjunctivae and EOM are normal. Pupils are equal, round, and reactive to light. Right eye exhibits no discharge. Left eye exhibits no discharge.  Neck: Neck supple.  Cardiovascular: Normal rate, regular rhythm, normal heart sounds and intact distal pulses.  Exam reveals no gallop and no friction rub.   No murmur heard. Bilateral radial, posterior tibialis and dorsalis pedis pulses are intact.    Pulmonary/Chest: Effort normal and breath sounds normal. No respiratory distress. He has no wheezes. He has no rales. He exhibits no tenderness.  Lungs clear to auscultation bilaterally. Symmetric chest expansion bilaterally. No chest wall tenderness to palpation.  Abdominal: Soft. He exhibits no distension. There is no tenderness. There is no guarding.  Abdomen is soft and nontender to palpation.  Musculoskeletal: Normal range of motion. He exhibits tenderness. He exhibits no edema or deformity.  Superficial abrasion noted to his left arm. Tenderness to his bilateral low back. No midline neck or back tenderness. Patient's bilateral clavicles are nontender to palpation. Bilateral shoulder, elbow, wrist, hip, knee and ankle joints are supple and nontender to palpation.  Lymphadenopathy:    He has no cervical adenopathy.  Neurological: He is alert and oriented to person, place, and time. No cranial nerve deficit. Coordination normal.  Patient is alert and oriented 3. Cranial nerves are intact. Speech is clear and coherent. Finger-to-nose is intact bilaterally. EOMs are intact. Vision is grossly intact  Skin: Skin is warm and dry. Capillary refill takes less than 2 seconds. No rash noted. He is not diaphoretic. No erythema. No pallor.  Psychiatric: He has a normal mood and affect.  His behavior is normal.  Nursing note and vitals reviewed.    ED Treatments / Results  Labs (all labs ordered are listed, but only abnormal results are displayed) Labs Reviewed - No data to display  EKG  EKG Interpretation None       Radiology Dg Thoracic Spine 2 View  Result Date: 06/14/2016 CLINICAL DATA:  Fall off ladder.  Mid back pain. EXAM: THORACIC SPINE 2 VIEWS COMPARISON:  03/25/2016 FINDINGS: There is no evidence of thoracic spine fracture. Alignment is normal. No other significant bone abnormalities are identified. IMPRESSION: Negative. Electronically Signed   By: Rolm Baptise M.D.   On: 06/14/2016 15:35   Dg Lumbar Spine Complete  Result Date: 06/14/2016 CLINICAL DATA:  Fall from ladder.  Low back pain. EXAM: LUMBAR SPINE - COMPLETE 4+ VIEW COMPARISON:  07/08/2014 lumbar spine radiographs. FINDINGS: This report assumes 5 non rib-bearing lumbar vertebrae. Stable mild-to-moderate dextrocurvature of the visualized thoracolumbar spine. Lumbar vertebral body heights are preserved, with no fracture. Mild to moderate multilevel degenerative disc disease in  the lumbar spine, most prominent at L3-4, not appreciably changed. No spondylolisthesis. Mild facet arthropathy bilaterally in the lower lumbar spine. No aggressive appearing focal osseous lesions. Aortic atherosclerosis. IMPRESSION: 1. No lumbar spine fracture or spondylolisthesis. 2. Mild-to-moderate multilevel degenerative changes in the lumbar spine. 3. Aortic atherosclerosis. Electronically Signed   By: Ilona Sorrel M.D.   On: 06/14/2016 15:29   Ct Head Wo Contrast  Result Date: 06/14/2016 CLINICAL DATA:  Initial evaluation for acute trauma, fall. EXAM: CT HEAD WITHOUT CONTRAST CT CERVICAL SPINE WITHOUT CONTRAST TECHNIQUE: Multidetector CT imaging of the head and cervical spine was performed following the standard protocol without intravenous contrast. Multiplanar CT image reconstructions of the cervical spine were also  generated. COMPARISON:  None. FINDINGS: CT HEAD FINDINGS Scalp soft tissues demonstrate no acute abnormality. No acute abnormality about the globes and orbits. Visualized paranasal sinuses are clear.  No mastoid effusion. Calvarium intact. Mild age-related cerebral atrophy with moderate chronic microvascular ischemic disease. Scattered vascular calcifications within the carotid siphons. Hypodensity at the level of the right caudate head favored to be chronic in nature. Associated mild ex vacuo dilatation of the right lateral ventricle present. Probable additional scattered remote lacunar infarcts within the left basal ganglia. No other evidence for acute large vessel territory infarct. No acute intracranial hemorrhage. No mass lesion, midline shift or mass effect. No hydrocephalus. No extra-axial fluid collection. CT CERVICAL SPINE FINDINGS The vertebral bodies are normally aligned with preservation of the normal cervical lordosis. Vertebral body heights are preserved. Normal C1-2 articulations are intact. No prevertebral soft tissue swelling. No acute fracture or listhesis. Moderate multilevel degenerative spondylolysis, most prevalent at C4-5, C5-6, and C6-7. Calcification of the posterior longitudinal ligament and ligamentum flavum noted. Multilevel facet arthropathy present. Visualized soft tissues of the neck are within normal limits. Visualized lung apices are clear without evidence of apical pneumothorax. IMPRESSION: CT BRAIN: 1. No acute intracranial process identified. 2. Age-related cerebral atrophy with moderate chronic microvascular ischemic disease. CT CERVICAL SPINE: 1. No acute traumatic injury within the cervical spine. 2. Moderate multilevel degenerative spondylolysis, greatest at C4-5 through C6-7. Electronically Signed   By: Jeannine Boga M.D.   On: 06/14/2016 15:56   Ct Cervical Spine Wo Contrast  Result Date: 06/14/2016 CLINICAL DATA:  Initial evaluation for acute trauma, fall. EXAM:  CT HEAD WITHOUT CONTRAST CT CERVICAL SPINE WITHOUT CONTRAST TECHNIQUE: Multidetector CT imaging of the head and cervical spine was performed following the standard protocol without intravenous contrast. Multiplanar CT image reconstructions of the cervical spine were also generated. COMPARISON:  None. FINDINGS: CT HEAD FINDINGS Scalp soft tissues demonstrate no acute abnormality. No acute abnormality about the globes and orbits. Visualized paranasal sinuses are clear.  No mastoid effusion. Calvarium intact. Mild age-related cerebral atrophy with moderate chronic microvascular ischemic disease. Scattered vascular calcifications within the carotid siphons. Hypodensity at the level of the right caudate head favored to be chronic in nature. Associated mild ex vacuo dilatation of the right lateral ventricle present. Probable additional scattered remote lacunar infarcts within the left basal ganglia. No other evidence for acute large vessel territory infarct. No acute intracranial hemorrhage. No mass lesion, midline shift or mass effect. No hydrocephalus. No extra-axial fluid collection. CT CERVICAL SPINE FINDINGS The vertebral bodies are normally aligned with preservation of the normal cervical lordosis. Vertebral body heights are preserved. Normal C1-2 articulations are intact. No prevertebral soft tissue swelling. No acute fracture or listhesis. Moderate multilevel degenerative spondylolysis, most prevalent at C4-5, C5-6, and C6-7. Calcification of the  posterior longitudinal ligament and ligamentum flavum noted. Multilevel facet arthropathy present. Visualized soft tissues of the neck are within normal limits. Visualized lung apices are clear without evidence of apical pneumothorax. IMPRESSION: CT BRAIN: 1. No acute intracranial process identified. 2. Age-related cerebral atrophy with moderate chronic microvascular ischemic disease. CT CERVICAL SPINE: 1. No acute traumatic injury within the cervical spine. 2. Moderate  multilevel degenerative spondylolysis, greatest at C4-5 through C6-7. Electronically Signed   By: Jeannine Boga M.D.   On: 06/14/2016 15:56    Procedures Procedures (including critical care time)  Medications Ordered in ED Medications  Tdap (BOOSTRIX) injection 0.5 mL (0.5 mLs Intramuscular Given 06/14/16 1547)  acetaminophen (TYLENOL) tablet 650 mg (650 mg Oral Given 06/14/16 1546)     Initial Impression / Assessment and Plan / ED Course  I have reviewed the triage vital signs and the nursing notes.  Pertinent labs & imaging results that were available during my care of the patient were reviewed by me and considered in my medical decision making (see chart for details).  Clinical Course   This is a 82 y.o. male who presents the emergency department after a fall today. Patient reports he was using a ladder at his house in the back of his pickup truck. He reports he was coming down from the roof when the ladder slipped on the truck and he fell to the ground.He thinks he was about 5-6 feet up.  He reports landing on his left side. He complains of neck pain and mid and low back pain.  He denies hitting his head or loss of consciousness. He has an abrasion to his left forearm but denies any pain to his extremities. On exam the patient is afebrile nontoxic appearing. He is no focal nodule deficits. No extremity tenderness on examination. Patient does have mid to low back tenderness on exam. No crepitus. No deformity. Tetanus updated in the emergency department. CT head and neck showed no acute findings. X-rays of his thoracic and lumbar spine show no acute findings. At reevaluation patient reports he is feeling somewhat better after Tylenol. He ambulates in the room without difficulty or assistance. Creatinine in June is 0.78. Will do a short 5 day course of naproxen and Tylenol for treatment of his pain. I encouraged him to follow-up with primary care and I discussed head injury return  precautions. I advised the patient to follow-up with their primary care provider this week. I advised the patient to return to the emergency department with new or worsening symptoms or new concerns. The patient verbalized understanding and agreement with plan.      Final Clinical Impressions(s) / ED Diagnoses   Final diagnoses:  Fall, initial encounter  Neck pain  Bilateral low back pain without sciatica  Bilateral thoracic back pain    New Prescriptions New Prescriptions   ACETAMINOPHEN (TYLENOL) 325 MG TABLET    Take 2 tablets (650 mg total) by mouth every 6 (six) hours as needed for mild pain or moderate pain.   NAPROXEN (NAPROSYN) 250 MG TABLET    Take 1 tablet (250 mg total) by mouth 2 (two) times daily with a meal.     Waynetta Pean, PA-C 06/14/16 1643    Virgel Manifold, MD 06/24/16 [phone]

## 2016-06-14 NOTE — ED Notes (Signed)
Patient was alert, oriented and stable upon discharge. RN went over AVS and patient had no further questions.  

## 2016-07-22 DIAGNOSIS — Z23 Encounter for immunization: Secondary | ICD-10-CM | POA: Diagnosis not present

## 2016-09-24 DIAGNOSIS — H2513 Age-related nuclear cataract, bilateral: Secondary | ICD-10-CM | POA: Diagnosis not present

## 2016-09-29 DIAGNOSIS — I10 Essential (primary) hypertension: Secondary | ICD-10-CM | POA: Diagnosis not present

## 2016-09-29 DIAGNOSIS — E7800 Pure hypercholesterolemia, unspecified: Secondary | ICD-10-CM | POA: Diagnosis not present

## 2016-09-29 DIAGNOSIS — Z1389 Encounter for screening for other disorder: Secondary | ICD-10-CM | POA: Diagnosis not present

## 2016-09-29 DIAGNOSIS — Z0000 Encounter for general adult medical examination without abnormal findings: Secondary | ICD-10-CM | POA: Diagnosis not present

## 2016-09-29 DIAGNOSIS — Z7189 Other specified counseling: Secondary | ICD-10-CM | POA: Diagnosis not present

## 2016-11-08 DIAGNOSIS — I10 Essential (primary) hypertension: Secondary | ICD-10-CM | POA: Diagnosis not present

## 2016-11-08 DIAGNOSIS — E7800 Pure hypercholesterolemia, unspecified: Secondary | ICD-10-CM | POA: Diagnosis not present

## 2016-11-08 DIAGNOSIS — R079 Chest pain, unspecified: Secondary | ICD-10-CM | POA: Diagnosis not present

## 2016-11-08 DIAGNOSIS — K210 Gastro-esophageal reflux disease with esophagitis: Secondary | ICD-10-CM | POA: Diagnosis not present

## 2016-11-14 NOTE — Progress Notes (Signed)
Cardiology Office Note    Date:  11/15/2016   ID:  Cody Contreras, DOB 18-Oct-1934, MRN [id number]  PCP:  Irven Shelling, MD  Cardiologist:  [facility] - Dr. Sallyanne Kuster  Chief Complaint  Patient presents with  . New Patient (Initial Visit)    History of Present Illness:    Cody Contreras is a 83 y.o. male with past medical history of HTN, HLD, and diverticulosis who presents to the office today as a new patient referral for evaluation of chest pain.   In talking with the patient today, he denies any prior history of known CAD. Reports having a stress test many years ago and it being normal at that time.  Reports he had been having chest discomfort for the past several months, occurring mostly at night when sitting down or lying down to go to sleep. The pain was lasting from 45 minutes up to 2 hours and then spontaneously resolve. Denies any association with food. No pain with exertion. Says the pain usually improves with belching. Would take Tums with improvement in his symptoms as well.  He was seen by his PCP last week for this and prescribed Esomeprazole 40mg  daily. Since starting this, he denies any repeat episodes of pain.  He checks his BP multiple times per week. Says it is usually in the 130's-140's/70's -80's. Says it is usually elevated when initially checked at his PCP's but improves after sitting down for several minutes.   He is active at baseline. Just retired from [employer] last year at the age of 85. He walks his dog daily and denies any exertional chest discomfort or dyspnea with exertion. No palpitations, dizziness, lightheadedness, orthopnea, PND, or lower extremity edema.  No known family history of CAD. His mother had Type 2 DM. Reports less than a 10 pack-year history, having quit smoking in 2000. No alcohol use or recreational drug use.    Past Medical History:  Diagnosis Date  . Chest pain    neg nuclear stress [phone] and 2002  .  Chronic back pain   . Colon polyps   . DDD (degenerative disc disease)   . Diverticulosis   . ED (erectile dysfunction)   . GERD (gastroesophageal reflux disease)   . Hyperlipidemia   . Hypertension     No past surgical history on file.  Current Medications: Outpatient Medications Prior to Visit  Medication Sig Dispense Refill  . acetaminophen (TYLENOL) 325 MG tablet Take 2 tablets (650 mg total) by mouth every 6 (six) hours as needed for mild pain or moderate pain. 30 tablet 0  . aspirin 81 MG tablet Take 81 mg by mouth daily.    Marland Kitchen lisinopril-hydrochlorothiazide (PRINZIDE,ZESTORETIC) 20-25 MG per tablet Take 1 tablet by mouth daily.    . Multiple Vitamin (MULTIVITAMIN) tablet Take 1 tablet by mouth daily.    . simvastatin (ZOCOR) 20 MG tablet Take 20 mg by mouth daily.    . vitamin B-12 (CYANOCOBALAMIN) 100 MCG tablet Take 100 mcg by mouth daily.    . naproxen (NAPROSYN) 250 MG tablet Take 1 tablet (250 mg total) by mouth 2 (two) times daily with a meal. 10 tablet 0   No facility-administered medications prior to visit.      Allergies:   Patient has no known allergies.   Social History   Social History  . Marital status: Divorced    Spouse name: N/A  . Number of children: N/A  . Years of education:  N/A   Social History Main Topics  . Smoking status: Former Smoker    Packs/day: 1.00    Years: 10.00    Types: Cigarettes    Quit date: 2000  . Smokeless tobacco: Never Used  . Alcohol use No  . Drug use: No  . Sexual activity: Not Asked   Other Topics Concern  . None   Social History Narrative  . None     Family History:  The patient's family history includes Diabetes in his mother.   Review of Systems:   Please see the history of present illness.     General:  No chills, fever, night sweats or weight changes.  Cardiovascular:  No dyspnea on exertion, edema, orthopnea, palpitations, paroxysmal nocturnal dyspnea. Positive for chest pain.  Dermatological: No  rash, lesions/masses Respiratory: No cough, dyspnea Urologic: No hematuria, dysuria Abdominal:   No nausea, vomiting, diarrhea, bright red blood per rectum, melena, or hematemesis Neurologic:  No visual changes, wkns, changes in mental status. All other systems reviewed and are otherwise negative except as noted above.   Physical Exam:    VS:  BP (!) 156/84   Pulse (!) 54   Ht 6\' 1"  (1.854 m)   Wt 193 lb 6.4 oz (87.7 kg)   BMI 25.52 kg/m    General: Well developed, well nourished [place] American male appearing in no acute distress. Head: Normocephalic, atraumatic, sclera non-icteric, no xanthomas, nares are without discharge.  Neck: No carotid bruits. JVD not elevated.  Lungs: Respirations regular and unlabored, without wheezes or rales.  Heart: Regular rhythm, bradycardia rate. No S3 or S4.  No murmur, no rubs, or gallops appreciated. Abdomen: Soft, non-tender, non-distended with normoactive bowel sounds. No hepatomegaly. No rebound/guarding. No obvious abdominal masses. Msk:  Strength and tone appear normal for age. No joint deformities or effusions. Extremities: No clubbing or cyanosis. No edema.  Distal pedal pulses are 2+ bilaterally. Neuro: Alert and oriented X 3. Moves all extremities spontaneously. No focal deficits noted. Psych:  Responds to questions appropriately with a normal affect. Skin: No rashes or lesions noted  Wt Readings from Last 3 Encounters:  11/15/16 193 lb 6.4 oz (87.7 kg)  06/14/16 190 lb (86.2 kg)  03/25/16 190 lb (86.2 kg)     Studies/Labs Reviewed:   EKG:  EKG is ordered today.  The ekg ordered today demonstrates sinus bradycardia, HR 48, with no acute ST or T-wave changes.  Recent Labs: 03/25/2016: BUN 9; Creatinine, Ser 0.78; Hemoglobin 12.4; Platelets 148; Potassium 3.6; Sodium 138   Lipid Panel No results found for: CHOL, TRIG, HDL, CHOLHDL, VLDL, LDLCALC, LDLDIRECT  Additional studies/ records that were reviewed today include:   EKG:  03/2016: Sinus bradycardia, HR 57, with no acute ST or T-wave changes.   Assessment:    1. Atypical chest pain   2. Essential hypertension   3. Hyperlipidemia, unspecified hyperlipidemia type      Plan:   In order of problems listed above:  1. Atypical Chest Pain - recently seen by PCP for evaluation of sternal chest pain. Had been occurring for the past several months, mostly at night when sitting down or lying down to go to sleep. The pain was lasting from 45 minutes up to 2 hours and then spontaneously resolving. No pain with exertion. Says the pain usually improved with belching or taking Tums. - recently seen by PCP and started on Esomeprazole 40mg  daily. Denies any repeat episodes of pain since.  - EKG today shows NSR  with no acute ST or T-wave changes.  - with his atypical symptoms and normal EKG, would not pursue further ischemic evaluation at this time. If pain represented while being treated for GERD, would obtain a Lexiscan Myoview for ischemic evaluation. He will let us know if pain reoccurs. This was discussed with Dr. Sallyanne Kuster (DOD) as he is a new patient with our practice and was in agreement with this assessment and plan.   2. HTN - BP initially elevated at 169/85, at 156/84 on recheck. Reports BP is usually in the 130's-140's/70's -80's. - continue Prinzide 20-25mg  daily. Was informed to follow-up with PCP if BP remains elevated.   3. HLD - followed by PCP. Continue Zocor 20mg  daily.    Medication Adjustments/Labs and Tests Ordered: Current medicines are reviewed at length with the patient today.  Concerns regarding medicines are outlined above.  Medication changes, Labs and Tests ordered today are listed in the Patient Instructions below. Patient Instructions  Medication Instructions:  Your physician recommends that you continue on your current medications as directed. Please refer to the Current Medication list given to you  today.  Labwork: NONE  Testing/Procedures: NONE  Follow-Up: Your physician recommends that you schedule a follow-up appointment AS NEEDED with Bernerd Pho PA.    Any Other Special Instructions Will Be Listed Below (If Applicable).  If you need a refill on your cardiac medications before your next appointment, please call your pharmacy.   Arna Medici, [place]  11/15/2016 4:35 PM    [facility] [address], [place], [place]  [address] Phone: [phone]; Fax: [phone]  [address] [place], [place] [address] Phone: [phone]

## 2016-11-15 ENCOUNTER — Ambulatory Visit (INDEPENDENT_AMBULATORY_CARE_PROVIDER_SITE_OTHER): Payer: Medicare Other | Admitting: Student

## 2016-11-15 ENCOUNTER — Encounter: Payer: Self-pay | Admitting: Student

## 2016-11-15 VITALS — BP 156/84 | HR 54 | Ht 73.0 in | Wt 193.4 lb

## 2016-11-15 DIAGNOSIS — E785 Hyperlipidemia, unspecified: Secondary | ICD-10-CM | POA: Diagnosis not present

## 2016-11-15 DIAGNOSIS — R0789 Other chest pain: Secondary | ICD-10-CM

## 2016-11-15 DIAGNOSIS — I10 Essential (primary) hypertension: Secondary | ICD-10-CM | POA: Diagnosis not present

## 2016-11-15 NOTE — Patient Instructions (Addendum)
Medication Instructions:  Your physician recommends that you continue on your current medications as directed. Please refer to the Current Medication list given to you today.  Labwork: NONE  Testing/Procedures: NONE  Follow-Up: Your physician recommends that you schedule a follow-up appointment AS NEEDED with Bernerd Pho PA.    Any Other Special Instructions Will Be Listed Below (If Applicable).     If you need a refill on your cardiac medications before your next appointment, please call your pharmacy.

## 2016-11-22 DIAGNOSIS — D126 Benign neoplasm of colon, unspecified: Secondary | ICD-10-CM | POA: Diagnosis not present

## 2016-11-22 DIAGNOSIS — I10 Essential (primary) hypertension: Secondary | ICD-10-CM | POA: Diagnosis not present

## 2016-11-22 DIAGNOSIS — K210 Gastro-esophageal reflux disease with esophagitis: Secondary | ICD-10-CM | POA: Diagnosis not present

## 2016-11-22 DIAGNOSIS — E7800 Pure hypercholesterolemia, unspecified: Secondary | ICD-10-CM | POA: Diagnosis not present

## 2017-03-31 DIAGNOSIS — I10 Essential (primary) hypertension: Secondary | ICD-10-CM | POA: Diagnosis not present

## 2017-03-31 DIAGNOSIS — K210 Gastro-esophageal reflux disease with esophagitis: Secondary | ICD-10-CM | POA: Diagnosis not present

## 2017-06-22 DIAGNOSIS — Z23 Encounter for immunization: Secondary | ICD-10-CM | POA: Diagnosis not present

## 2017-10-07 DIAGNOSIS — I10 Essential (primary) hypertension: Secondary | ICD-10-CM | POA: Diagnosis not present

## 2017-10-07 DIAGNOSIS — Z1389 Encounter for screening for other disorder: Secondary | ICD-10-CM | POA: Diagnosis not present

## 2017-10-07 DIAGNOSIS — R739 Hyperglycemia, unspecified: Secondary | ICD-10-CM | POA: Diagnosis not present

## 2017-10-07 DIAGNOSIS — Z0000 Encounter for general adult medical examination without abnormal findings: Secondary | ICD-10-CM | POA: Diagnosis not present

## 2017-10-07 DIAGNOSIS — E7800 Pure hypercholesterolemia, unspecified: Secondary | ICD-10-CM | POA: Diagnosis not present

## 2017-10-07 DIAGNOSIS — E538 Deficiency of other specified B group vitamins: Secondary | ICD-10-CM | POA: Diagnosis not present

## 2017-10-07 DIAGNOSIS — Z7189 Other specified counseling: Secondary | ICD-10-CM | POA: Diagnosis not present

## 2017-10-07 DIAGNOSIS — K219 Gastro-esophageal reflux disease without esophagitis: Secondary | ICD-10-CM | POA: Diagnosis not present

## 2018-04-06 DIAGNOSIS — I10 Essential (primary) hypertension: Secondary | ICD-10-CM | POA: Diagnosis not present

## 2018-06-17 DIAGNOSIS — Z23 Encounter for immunization: Secondary | ICD-10-CM | POA: Diagnosis not present

## 2018-10-09 DIAGNOSIS — K219 Gastro-esophageal reflux disease without esophagitis: Secondary | ICD-10-CM | POA: Diagnosis not present

## 2018-10-09 DIAGNOSIS — R7301 Impaired fasting glucose: Secondary | ICD-10-CM | POA: Diagnosis not present

## 2018-10-09 DIAGNOSIS — E7800 Pure hypercholesterolemia, unspecified: Secondary | ICD-10-CM | POA: Diagnosis not present

## 2018-10-09 DIAGNOSIS — Z0000 Encounter for general adult medical examination without abnormal findings: Secondary | ICD-10-CM | POA: Diagnosis not present

## 2018-10-09 DIAGNOSIS — Z1389 Encounter for screening for other disorder: Secondary | ICD-10-CM | POA: Diagnosis not present

## 2018-10-09 DIAGNOSIS — I10 Essential (primary) hypertension: Secondary | ICD-10-CM | POA: Diagnosis not present

## 2018-10-26 ENCOUNTER — Emergency Department (HOSPITAL_COMMUNITY): Payer: Medicare Other

## 2018-10-26 ENCOUNTER — Emergency Department (HOSPITAL_COMMUNITY)
Admission: EM | Admit: 2018-10-26 | Discharge: 2018-10-26 | Disposition: A | Discharge status: Home / Self Care | Payer: Medicare Other | Attending: Emergency Medicine | Admitting: Emergency Medicine

## 2018-10-26 ENCOUNTER — Encounter (HOSPITAL_COMMUNITY): Payer: Self-pay | Admitting: Emergency Medicine

## 2018-10-26 DIAGNOSIS — Z87891 Personal history of nicotine dependence: Secondary | ICD-10-CM | POA: Insufficient documentation

## 2018-10-26 DIAGNOSIS — Z79899 Other long term (current) drug therapy: Secondary | ICD-10-CM | POA: Insufficient documentation

## 2018-10-26 DIAGNOSIS — R0789 Other chest pain: Secondary | ICD-10-CM | POA: Diagnosis not present

## 2018-10-26 DIAGNOSIS — I10 Essential (primary) hypertension: Secondary | ICD-10-CM | POA: Insufficient documentation

## 2018-10-26 DIAGNOSIS — Z7982 Long term (current) use of aspirin: Secondary | ICD-10-CM | POA: Diagnosis not present

## 2018-10-26 DIAGNOSIS — R079 Chest pain, unspecified: Secondary | ICD-10-CM | POA: Diagnosis not present

## 2018-10-26 LAB — CBC
HCT: 39.3 % (ref 39.0–52.0)
Hemoglobin: 13.1 g/dL (ref 13.0–17.0)
MCH: 31.2 pg (ref 26.0–34.0)
MCHC: 33.3 g/dL (ref 30.0–36.0)
MCV: 93.6 fL (ref 80.0–100.0)
Platelets: 154 10*3/uL (ref 150–400)
RBC: 4.2 MIL/uL — ABNORMAL LOW (ref 4.22–5.81)
RDW: 12.8 % (ref 11.5–15.5)
WBC: 4.4 10*3/uL (ref 4.0–10.5)
nRBC: 0 % (ref 0.0–0.2)

## 2018-10-26 LAB — I-STAT TROPONIN, ED
Troponin i, poc: 0 ng/mL (ref 0.00–0.08)
Troponin i, poc: 0.01 ng/mL (ref 0.00–0.08)

## 2018-10-26 LAB — BASIC METABOLIC PANEL
Anion gap: 10 (ref 5–15)
BUN: 15 mg/dL (ref 8–23)
CO2: 27 mmol/L (ref 22–32)
Calcium: 9.1 mg/dL (ref 8.9–10.3)
Chloride: 102 mmol/L (ref 98–111)
Creatinine, Ser: 0.95 mg/dL (ref 0.61–1.24)
GFR calc Af Amer: >60 mL/min (ref >60)
GFR calc non Af Amer: >60 mL/min (ref >60)
Glucose, Bld: 93 mg/dL (ref 70–99)
Potassium: 3.6 mmol/L (ref 3.5–5.1)
Sodium: 139 mmol/L (ref 135–145)

## 2018-10-26 NOTE — ED Triage Notes (Signed)
Pt started having left sided CP this morning. Pt denies SOB, n/v. Pain is nonradiating.

## 2018-10-26 NOTE — Discharge Instructions (Addendum)
Follow-up with your primary doctor soon as possible.  Return here as needed.  Your testing here today did not show any significant abnormality.

## 2018-10-27 ENCOUNTER — Other Ambulatory Visit (HOSPITAL_COMMUNITY): Payer: Self-pay | Admitting: Internal Medicine

## 2018-10-27 ENCOUNTER — Other Ambulatory Visit: Payer: Self-pay | Admitting: Internal Medicine

## 2018-10-27 DIAGNOSIS — R072 Precordial pain: Secondary | ICD-10-CM

## 2018-10-27 NOTE — ED Provider Notes (Signed)
[facility] EMERGENCY DEPARTMENT Provider Note   CSN: [id number] Arrival date & time: 10/26/18  1328     History   Chief Complaint Chief Complaint  Patient presents with  . Chest Pain    HPI Cody Contreras is a 85 y.o. male.  HPI Patient presents to the emergency department with left-sided chest pain that started this morning.  The patient states it felt like he had some indigestion which she has had in the past.  Patient states it lasted persistently for about 2 hours.  He states he had no other symptoms associated with this.  Patient states that he is having no symptoms at this time.  The patient denies shortness of breath, headache,blurred vision, neck pain, fever, cough, weakness, numbness, dizziness, anorexia, edema, abdominal pain, nausea, vomiting, diarrhea, rash, back pain, dysuria, hematemesis, bloody stool, near syncope, or syncope. Past Medical History:  Diagnosis Date  . Chest pain    neg nuclear stress [phone] and 2002  . Chronic back pain   . Colon polyps   . DDD (degenerative disc disease)   . Diverticulosis   . ED (erectile dysfunction)   . GERD (gastroesophageal reflux disease)   . Hyperlipidemia   . Hypertension     Patient Active Problem List   Diagnosis Date Noted  . Essential hypertension 11/15/2016    History reviewed. No pertinent surgical history.      Home Medications    Prior to Admission medications   Medication Sig Start Date End Date Taking? Authorizing Provider  aspirin 81 MG tablet Take 81 mg by mouth daily.   Yes [provider]  esomeprazole (NEXIUM) 40 MG capsule Take 40 mg by mouth daily before breakfast.    Yes [provider]  lisinopril-hydrochlorothiazide (PRINZIDE,ZESTORETIC) 20-25 MG per tablet Take 1 tablet by mouth daily.   Yes [provider]  Multiple Vitamin (MULTIVITAMIN) tablet Take 1 tablet by mouth daily.   Yes [provider]  naproxen sodium (ALEVE) 220 MG  tablet Take 220-440 mg by mouth 2 (two) times daily as needed (for pain).   Yes [provider]  simvastatin (ZOCOR) 20 MG tablet Take 20 mg by mouth at bedtime.    Yes [provider]  vitamin B-12 (CYANOCOBALAMIN) 100 MCG tablet Take 100 mcg by mouth daily.   Yes [provider]  acetaminophen (TYLENOL) 325 MG tablet Take 2 tablets (650 mg total) by mouth every 6 (six) hours as needed for mild pain or moderate pain. Patient not taking: Reported on 10/26/2018 06/14/16   Waynetta Pean, PA-C    Family History Family History  Problem Relation Age of Onset  . Diabetes Mother     Social History Social History   Tobacco Use  . Smoking status: Former Smoker    Packs/day: 1.00    Years: 10.00    Pack years: 10.00    Types: Cigarettes    Last attempt to quit: 2000    Years since quitting: 20.0  . Smokeless tobacco: Never Used  Substance Use Topics  . Alcohol use: No  . Drug use: No     Allergies   Patient has no known allergies.   Review of Systems Review of Systems All other systems negative except as documented in the HPI. All pertinent positives and negatives as reviewed in the HPI.  Physical Exam Updated Vital Signs BP (!) 180/97   Pulse (!) 53   Temp 97.8 F (36.6 C) (Oral)   Resp (!) 21  Ht 6\' 1"  (1.854 m)   Wt 82.6 kg   SpO2 100%   BMI 24.01 kg/m   Physical Exam Vitals signs and nursing note reviewed.  Constitutional:      General: He is not in acute distress.    Appearance: He is well-developed.  HENT:     Head: Normocephalic and atraumatic.  Eyes:     Pupils: Pupils are equal, round, and reactive to light.  Neck:     Musculoskeletal: Normal range of motion and neck supple.  Cardiovascular:     Rate and Rhythm: Normal rate and regular rhythm.     Heart sounds: Normal heart sounds. No murmur. No friction rub. No gallop.   Pulmonary:     Effort: Pulmonary effort is normal. No accessory muscle usage or respiratory distress.      Breath sounds: Normal breath sounds. No wheezing.  Abdominal:     General: Bowel sounds are normal. There is no distension.     Palpations: Abdomen is soft.     Tenderness: There is no abdominal tenderness.  Skin:    General: Skin is warm and dry.     Capillary Refill: Capillary refill takes less than 2 seconds.     Findings: No erythema or rash.  Neurological:     Mental Status: He is alert and oriented to person, place, and time.     Motor: No abnormal muscle tone.     Coordination: Coordination normal.  Psychiatric:        Behavior: Behavior normal.      ED Treatments / Results  Labs (all labs ordered are listed, but only abnormal results are displayed) Labs Reviewed  CBC - Abnormal; Notable for the following components:      Result Value   RBC 4.20 (*)    All other components within normal limits  BASIC METABOLIC PANEL  I-STAT TROPONIN, ED  I-STAT TROPONIN, ED    EKG EKG Interpretation  Date/Time:  Thursday October 26 2018 13:32:57 EST Ventricular Rate:  56 PR Interval:  170 QRS Duration: 68 QT Interval:  406 QTC Calculation: 391 R Axis:   34 Text Interpretation:  Sinus bradycardia Possible Left atrial enlargement Low voltage QRS Septal infarct , age undetermined Abnormal ECG Confirmed by Virgel Manifold [phone]) on 10/26/2018 3:47:14 PM   Radiology Dg Chest 2 View  Result Date: 10/26/2018 CLINICAL DATA:  Nonstop chest pain for 2 days EXAM: CHEST - 2 VIEW COMPARISON:  03/25/2016 FINDINGS: The heart size and mediastinal contours are within normal limits. Both lungs are clear. The visualized skeletal structures are unremarkable. IMPRESSION: No active cardiopulmonary disease. Electronically Signed   By: Inez Catalina M.D.   On: 10/26/2018 14:18    Procedures Procedures (including critical care time)  Medications Ordered in ED Medications - No data to display   Initial Impression / Assessment and Plan / ED Course  I have reviewed the triage vital signs and the  nursing notes.  Pertinent labs & imaging results that were available during my care of the patient were reviewed by me and considered in my medical decision making (see chart for details).    The patient's laboratory testing did not show any abnormalities at this time.  His EKG does not show any significant abnormalities or acute changes.  Patient's x-ray does not show any abnormalities.  The patient's not had any symptoms since earlier this morning.  I did advise him that this still could represent a cardiac etiology but at this time there  is no significant findings.  I did advise him to follow-up with his primary doctor.  Told to return here as needed.   Final Clinical Impressions(s) / ED Diagnoses   Final diagnoses:  Other chest pain    ED Discharge Orders    None       Dalia Heading, PA-C 11/01/18 0005    Virgel Manifold, MD 11/01/18 1601

## 2018-10-31 ENCOUNTER — Telehealth (HOSPITAL_COMMUNITY): Payer: Self-pay

## 2018-10-31 NOTE — Telephone Encounter (Signed)
Contacted the patient's wife and she took his instructions while he was listening. They stated that they understood and would be there. S.Matina Rodier EMTP

## 2018-11-02 ENCOUNTER — Ambulatory Visit (HOSPITAL_COMMUNITY): Payer: Medicare Other | Attending: Internal Medicine

## 2018-11-02 ENCOUNTER — Encounter (HOSPITAL_COMMUNITY): Payer: Medicare Other

## 2018-11-02 DIAGNOSIS — R072 Precordial pain: Secondary | ICD-10-CM | POA: Insufficient documentation

## 2018-11-02 LAB — MYOCARDIAL PERFUSION IMAGING
LV dias vol: 66 mL (ref 62–150)
LV sys vol: 20 mL
Peak HR: 87 {beats}/min
Rest HR: 45 {beats}/min
SDS: 1
SRS: 0
SSS: 1
TID: 1.03

## 2018-11-02 MED ORDER — TECHNETIUM TC 99M TETROFOSMIN IV KIT
10.2000 | PACK | Freq: Once | INTRAVENOUS | Status: AC | PRN
  Administered 2018-11-02: 10.2 via INTRAVENOUS
  Filled 2018-11-02: qty 11

## 2018-11-02 MED ORDER — TECHNETIUM TC 99M TETROFOSMIN IV KIT
32.7000 | PACK | Freq: Once | INTRAVENOUS | Status: AC | PRN
  Administered 2018-11-02: 32.7 via INTRAVENOUS
  Filled 2018-11-02: qty 33

## 2018-11-02 MED ORDER — REGADENOSON 0.4 MG/5ML IV SOLN
0.4000 mg | Freq: Once | INTRAVENOUS | Status: AC
  Administered 2018-11-02: 0.4 mg via INTRAVENOUS

## 2019-03-22 DIAGNOSIS — K219 Gastro-esophageal reflux disease without esophagitis: Secondary | ICD-10-CM | POA: Diagnosis not present

## 2019-03-22 DIAGNOSIS — I10 Essential (primary) hypertension: Secondary | ICD-10-CM | POA: Diagnosis not present

## 2019-04-17 DIAGNOSIS — I10 Essential (primary) hypertension: Secondary | ICD-10-CM | POA: Diagnosis not present

## 2019-06-27 DIAGNOSIS — Z23 Encounter for immunization: Secondary | ICD-10-CM | POA: Diagnosis not present

## 2019-09-11 ENCOUNTER — Other Ambulatory Visit: Payer: Self-pay

## 2019-09-11 DIAGNOSIS — Z20828 Contact with and (suspected) exposure to other viral communicable diseases: Secondary | ICD-10-CM | POA: Diagnosis not present

## 2019-09-11 DIAGNOSIS — Z20822 Contact with and (suspected) exposure to covid-19: Secondary | ICD-10-CM

## 2019-09-12 LAB — NOVEL CORONAVIRUS, NAA: SARS-CoV-2, NAA: NOT DETECTED

## 2019-09-17 ENCOUNTER — Telehealth: Payer: Self-pay | Admitting: *Deleted

## 2019-09-17 NOTE — Telephone Encounter (Signed)
Patient called and was given negative covid results . 

## 2019-12-03 ENCOUNTER — Ambulatory Visit: Payer: Medicare Other | Attending: Internal Medicine

## 2019-12-03 DIAGNOSIS — Z23 Encounter for immunization: Secondary | ICD-10-CM | POA: Insufficient documentation

## 2019-12-03 NOTE — Progress Notes (Signed)
   Covid-19 Vaccination Clinic  Name:  Cody Contreras    MRN: [id number] DOB: [birth year]  12/03/2019  Mr. [name] was observed post Covid-19 immunization for 15 minutes without incidence. He was provided with Vaccine Information Sheet and instruction to access the V-Safe system.   Mr. [name] was instructed to call 911 with any severe reactions post vaccine: Marland Kitchen Difficulty breathing  . Swelling of your face and throat  . A fast heartbeat  . A bad rash all over your body  . Dizziness and weakness    Immunizations Administered    Name Date Dose VIS Date Route   Pfizer COVID-19 Vaccine 12/03/2019  3:44 PM 0.3 mL 09/28/2019 Intramuscular   Manufacturer: [place]   Lot: [id number]   [place]: [id number]

## 2019-12-25 ENCOUNTER — Ambulatory Visit: Payer: Medicare Other | Attending: Internal Medicine

## 2019-12-25 DIAGNOSIS — Z23 Encounter for immunization: Secondary | ICD-10-CM | POA: Insufficient documentation

## 2019-12-25 NOTE — Progress Notes (Signed)
   Covid-19 Vaccination Clinic  Name:  Cody Contreras    MRN: [id number] DOB: 02-18-1934  12/25/2019  [name] was observed post Covid-19 immunization for 15 minutes without incident. He was provided with Vaccine Information Sheet and instruction to access the V-Safe system.   [name] was instructed to call 911 with any severe reactions post vaccine: Marland Kitchen Difficulty breathing  . Swelling of face and throat  . A fast heartbeat  . A bad rash all over body  . Dizziness and weakness   Immunizations Administered    Name Date Dose VIS Date Route   Pfizer COVID-19 Vaccine 12/25/2019  9:01 AM 0.3 mL 09/28/2019 Intramuscular   Manufacturer: [place]   Lot: [id number]   [place]: [id number]

## 2019-12-26 ENCOUNTER — Ambulatory Visit: Payer: Medicare Other

## 2020-01-24 ENCOUNTER — Other Ambulatory Visit: Payer: Self-pay | Admitting: Internal Medicine

## 2020-01-24 DIAGNOSIS — R413 Other amnesia: Secondary | ICD-10-CM | POA: Diagnosis not present

## 2020-01-24 DIAGNOSIS — R42 Dizziness and giddiness: Secondary | ICD-10-CM | POA: Diagnosis not present

## 2020-01-31 ENCOUNTER — Other Ambulatory Visit: Payer: Self-pay | Admitting: Internal Medicine

## 2020-01-31 DIAGNOSIS — R413 Other amnesia: Secondary | ICD-10-CM

## 2020-02-08 ENCOUNTER — Ambulatory Visit
Admission: RE | Admit: 2020-02-08 | Discharge: 2020-02-08 | Disposition: A | Discharge status: Home / Self Care | Payer: Medicare Other | Source: Ambulatory Visit | Attending: Internal Medicine | Admitting: Internal Medicine

## 2020-02-08 DIAGNOSIS — R42 Dizziness and giddiness: Secondary | ICD-10-CM | POA: Diagnosis not present

## 2020-02-08 DIAGNOSIS — R413 Other amnesia: Secondary | ICD-10-CM

## 2020-02-08 MED ORDER — IOHEXOL 300 MG/ML  SOLN
75.0000 mL | Freq: Once | INTRAMUSCULAR | Status: AC | PRN
  Administered 2020-02-08: 75 mL via INTRAVENOUS

## 2020-04-03 DIAGNOSIS — H6123 Impacted cerumen, bilateral: Secondary | ICD-10-CM | POA: Diagnosis not present

## 2020-04-03 DIAGNOSIS — H8112 Benign paroxysmal vertigo, left ear: Secondary | ICD-10-CM | POA: Diagnosis not present

## 2020-07-03 DIAGNOSIS — Z23 Encounter for immunization: Secondary | ICD-10-CM | POA: Diagnosis not present

## 2020-07-29 DIAGNOSIS — Z23 Encounter for immunization: Secondary | ICD-10-CM | POA: Diagnosis not present

## 2020-10-28 DIAGNOSIS — F0390 Unspecified dementia without behavioral disturbance: Secondary | ICD-10-CM | POA: Diagnosis not present

## 2020-10-28 DIAGNOSIS — I10 Essential (primary) hypertension: Secondary | ICD-10-CM | POA: Diagnosis not present

## 2020-10-28 DIAGNOSIS — E7800 Pure hypercholesterolemia, unspecified: Secondary | ICD-10-CM | POA: Diagnosis not present

## 2020-10-28 DIAGNOSIS — K2100 Gastro-esophageal reflux disease with esophagitis, without bleeding: Secondary | ICD-10-CM | POA: Diagnosis not present

## 2020-11-13 DIAGNOSIS — I10 Essential (primary) hypertension: Secondary | ICD-10-CM | POA: Diagnosis not present

## 2020-11-13 DIAGNOSIS — E7800 Pure hypercholesterolemia, unspecified: Secondary | ICD-10-CM | POA: Diagnosis not present

## 2021-03-20 DIAGNOSIS — Z23 Encounter for immunization: Secondary | ICD-10-CM | POA: Diagnosis not present

## 2021-04-28 DIAGNOSIS — K219 Gastro-esophageal reflux disease without esophagitis: Secondary | ICD-10-CM | POA: Diagnosis not present

## 2021-04-28 DIAGNOSIS — I10 Essential (primary) hypertension: Secondary | ICD-10-CM | POA: Diagnosis not present

## 2021-04-28 DIAGNOSIS — F0390 Unspecified dementia without behavioral disturbance: Secondary | ICD-10-CM | POA: Diagnosis not present

## 2021-06-19 DIAGNOSIS — Z23 Encounter for immunization: Secondary | ICD-10-CM | POA: Diagnosis not present

## 2021-06-22 DIAGNOSIS — R55 Syncope and collapse: Secondary | ICD-10-CM | POA: Diagnosis not present

## 2021-06-22 DIAGNOSIS — I959 Hypotension, unspecified: Secondary | ICD-10-CM | POA: Diagnosis not present

## 2021-06-22 DIAGNOSIS — R1111 Vomiting without nausea: Secondary | ICD-10-CM | POA: Diagnosis not present

## 2021-12-14 DIAGNOSIS — R7301 Impaired fasting glucose: Secondary | ICD-10-CM | POA: Diagnosis not present

## 2021-12-14 DIAGNOSIS — F0390 Unspecified dementia without behavioral disturbance: Secondary | ICD-10-CM | POA: Diagnosis not present

## 2021-12-14 DIAGNOSIS — E7800 Pure hypercholesterolemia, unspecified: Secondary | ICD-10-CM | POA: Diagnosis not present

## 2021-12-14 DIAGNOSIS — Z1389 Encounter for screening for other disorder: Secondary | ICD-10-CM | POA: Diagnosis not present

## 2021-12-14 DIAGNOSIS — I10 Essential (primary) hypertension: Secondary | ICD-10-CM | POA: Diagnosis not present

## 2021-12-14 DIAGNOSIS — K2100 Gastro-esophageal reflux disease with esophagitis, without bleeding: Secondary | ICD-10-CM | POA: Diagnosis not present

## 2021-12-14 DIAGNOSIS — Z0000 Encounter for general adult medical examination without abnormal findings: Secondary | ICD-10-CM | POA: Diagnosis not present

## 2022-09-03 ENCOUNTER — Emergency Department (HOSPITAL_COMMUNITY): Payer: Medicare Other

## 2022-09-03 ENCOUNTER — Other Ambulatory Visit: Payer: Self-pay

## 2022-09-03 ENCOUNTER — Observation Stay (HOSPITAL_COMMUNITY)
Admission: EM | Admit: 2022-09-03 | Discharge: 2022-09-04 | Disposition: A | Discharge status: Home / Self Care | Payer: Medicare Other | Attending: Internal Medicine | Admitting: Internal Medicine

## 2022-09-03 ENCOUNTER — Encounter (HOSPITAL_COMMUNITY): Payer: Self-pay

## 2022-09-03 DIAGNOSIS — R2681 Unsteadiness on feet: Secondary | ICD-10-CM | POA: Diagnosis not present

## 2022-09-03 DIAGNOSIS — R269 Unspecified abnormalities of gait and mobility: Secondary | ICD-10-CM | POA: Insufficient documentation

## 2022-09-03 DIAGNOSIS — F0390 Unspecified dementia without behavioral disturbance: Secondary | ICD-10-CM | POA: Insufficient documentation

## 2022-09-03 DIAGNOSIS — I10 Essential (primary) hypertension: Secondary | ICD-10-CM | POA: Insufficient documentation

## 2022-09-03 DIAGNOSIS — R55 Syncope and collapse: Secondary | ICD-10-CM | POA: Diagnosis present

## 2022-09-03 DIAGNOSIS — Z87891 Personal history of nicotine dependence: Secondary | ICD-10-CM | POA: Diagnosis not present

## 2022-09-03 DIAGNOSIS — R400 Somnolence: Secondary | ICD-10-CM | POA: Diagnosis not present

## 2022-09-03 DIAGNOSIS — E785 Hyperlipidemia, unspecified: Secondary | ICD-10-CM | POA: Insufficient documentation

## 2022-09-03 DIAGNOSIS — Z79899 Other long term (current) drug therapy: Secondary | ICD-10-CM | POA: Insufficient documentation

## 2022-09-03 DIAGNOSIS — Z1152 Encounter for screening for COVID-19: Secondary | ICD-10-CM | POA: Insufficient documentation

## 2022-09-03 DIAGNOSIS — R5383 Other fatigue: Secondary | ICD-10-CM | POA: Insufficient documentation

## 2022-09-03 DIAGNOSIS — Z7982 Long term (current) use of aspirin: Secondary | ICD-10-CM | POA: Diagnosis not present

## 2022-09-03 DIAGNOSIS — R001 Bradycardia, unspecified: Secondary | ICD-10-CM

## 2022-09-03 LAB — URINALYSIS, ROUTINE W REFLEX MICROSCOPIC
Bacteria, UA: NONE SEEN
Bilirubin Urine: NEGATIVE
Glucose, UA: NEGATIVE mg/dL
Hgb urine dipstick: NEGATIVE
Ketones, ur: NEGATIVE mg/dL
Leukocytes,Ua: NEGATIVE
Nitrite: NEGATIVE
Protein, ur: 30 mg/dL — AB
Specific Gravity, Urine: 1.015 (ref 1.005–1.030)
pH: 7 (ref 5.0–8.0)

## 2022-09-03 LAB — COMPREHENSIVE METABOLIC PANEL
ALT: 13 U/L (ref 0–44)
AST: 26 U/L (ref 15–41)
Albumin: 3.6 g/dL (ref 3.5–5.0)
Alkaline Phosphatase: 58 U/L (ref 38–126)
Anion gap: 10 (ref 5–15)
BUN: 13 mg/dL (ref 8–23)
CO2: 26 mmol/L (ref 22–32)
Calcium: 9.3 mg/dL (ref 8.9–10.3)
Chloride: 100 mmol/L (ref 98–111)
Creatinine, Ser: 1.16 mg/dL (ref 0.61–1.24)
GFR, Estimated: >60 mL/min (ref >60)
Glucose, Bld: 139 mg/dL — ABNORMAL HIGH (ref 70–99)
Potassium: 4 mmol/L (ref 3.5–5.1)
Sodium: 136 mmol/L (ref 135–145)
Total Bilirubin: 0.5 mg/dL (ref 0.3–1.2)
Total Protein: 6.6 g/dL (ref 6.5–8.1)

## 2022-09-03 LAB — CBC
HCT: 33.7 % — ABNORMAL LOW (ref 39.0–52.0)
Hemoglobin: 11.6 g/dL — ABNORMAL LOW (ref 13.0–17.0)
MCH: 32.6 pg (ref 26.0–34.0)
MCHC: 34.4 g/dL (ref 30.0–36.0)
MCV: 94.7 fL (ref 80.0–100.0)
Platelets: 125 10*3/uL — ABNORMAL LOW (ref 150–400)
RBC: 3.56 MIL/uL — ABNORMAL LOW (ref 4.22–5.81)
RDW: 12.6 % (ref 11.5–15.5)
WBC: 4 10*3/uL (ref 4.0–10.5)
nRBC: 0 % (ref 0.0–0.2)

## 2022-09-03 LAB — TROPONIN I (HIGH SENSITIVITY): Troponin I (High Sensitivity): 5 ng/L (ref <18)

## 2022-09-03 LAB — RESP PANEL BY RT-PCR (FLU A&B, COVID) ARPGX2
Influenza A by PCR: NEGATIVE
Influenza B by PCR: NEGATIVE
SARS Coronavirus 2 by RT PCR: NEGATIVE

## 2022-09-03 MED ORDER — AMLODIPINE BESYLATE 10 MG PO TABS
10.0000 mg | ORAL_TABLET | Freq: Every day | ORAL | Status: DC
  Administered 2022-09-04: 10 mg via ORAL
  Filled 2022-09-03: qty 1

## 2022-09-03 MED ORDER — HYDROCHLOROTHIAZIDE 25 MG PO TABS
25.0000 mg | ORAL_TABLET | Freq: Every day | ORAL | Status: DC
  Administered 2022-09-04: 25 mg via ORAL
  Filled 2022-09-03: qty 1

## 2022-09-03 MED ORDER — CITALOPRAM HYDROBROMIDE 20 MG PO TABS
10.0000 mg | ORAL_TABLET | Freq: Every day | ORAL | Status: DC
  Administered 2022-09-04: 10 mg via ORAL
  Filled 2022-09-03: qty 1

## 2022-09-03 MED ORDER — PANTOPRAZOLE SODIUM 40 MG PO TBEC
40.0000 mg | DELAYED_RELEASE_TABLET | Freq: Every day | ORAL | Status: DC
  Administered 2022-09-04: 40 mg via ORAL
  Filled 2022-09-03: qty 1

## 2022-09-03 MED ORDER — LISINOPRIL-HYDROCHLOROTHIAZIDE 20-25 MG PO TABS: 1.0000 | ORAL_TABLET | Freq: Every day | ORAL | Status: DC

## 2022-09-03 MED ORDER — LISINOPRIL 20 MG PO TABS
20.0000 mg | ORAL_TABLET | Freq: Every day | ORAL | Status: DC
  Administered 2022-09-04: 20 mg via ORAL
  Filled 2022-09-03: qty 1

## 2022-09-03 MED ORDER — ENOXAPARIN SODIUM 40 MG/0.4ML IJ SOSY
40.0000 mg | PREFILLED_SYRINGE | INTRAMUSCULAR | Status: DC
  Administered 2022-09-03: 40 mg via SUBCUTANEOUS
  Filled 2022-09-03: qty 0.4

## 2022-09-03 MED ORDER — DONEPEZIL HCL 10 MG PO TABS
10.0000 mg | ORAL_TABLET | Freq: Every day | ORAL | Status: DC
  Administered 2022-09-03: 10 mg via ORAL
  Filled 2022-09-03: qty 1

## 2022-09-03 MED ORDER — SIMVASTATIN 20 MG PO TABS
20.0000 mg | ORAL_TABLET | Freq: Every day | ORAL | Status: DC
  Administered 2022-09-03: 20 mg via ORAL
  Filled 2022-09-03: qty 1

## 2022-09-03 MED ORDER — SODIUM CHLORIDE 0.9% FLUSH
3.0000 mL | Freq: Two times a day (BID) | INTRAVENOUS | Status: DC
  Administered 2022-09-03 – 2022-09-04 (×2): 3 mL via INTRAVENOUS

## 2022-09-03 NOTE — Assessment & Plan Note (Signed)
Continue statin. 

## 2022-09-03 NOTE — H&P (Signed)
History and Physical    Patient: Cody Contreras [id number] DOB: 1934/09/01 DOA: 09/03/2022 DOS: the patient was seen and examined on 09/03/2022 PCP: Lavone Orn, MD  Patient coming from: Home  Chief Complaint:  Chief Complaint  Patient presents with   Near Syncope   HPI: Cody Contreras is a 88 y.o. male with medical history significant of dementia, HTM HLD and GERD who presents following syncopal episode.   Wife at bedside provides hx since pt has dementia and is alert and oriented only to self and place.  Patient vacuumed and went out to the porch to sweep leaves as he usually does.  He then called out to his wife for help.  She found him slumped over in his chair with his eyes closed and mumbling.  He initially did not respond to her but later replied no when she said that she would call the ambulance.  Wife also believes he soiled his pants on EMS arrival.  Normally he is not incontinent. Patient was otherwise in his normal state of health.  He has history of vertigo but usually has associated vomiting with it which she did not have this time.  Wife says he has good appetite and no recent vomiting or diarrhea.  Only new medication was Seroquel 25 mg that was started 2 days ago.  Wife gave him a dose last night and then also a dose this morning.  In the ED, he was afebrile, heart rate of 56, BP of 107/57.  WBC of 4, hemoglobin of 11.6, platelet of 125  Sodium of 136, K of 4, creatinine of 1.16, CBG of 139  Troponin of 5  Negative flu/COVID PCR.  UA is negative.  CT head is negative.  Review of Systems: unable to review all systems due to the inability of the patient to answer questions. Past Medical History:  Diagnosis Date   Chest pain    neg nuclear stress [phone] and 2002   Chronic back pain    Colon polyps    DDD (degenerative disc disease)    Diverticulosis    ED (erectile dysfunction)    GERD (gastroesophageal reflux disease)    Hyperlipidemia     Hypertension    History reviewed. No pertinent surgical history. Social History:  reports that he quit smoking about 23 years ago. His smoking use included cigarettes. He has a 10.00 pack-year smoking history. He has never used smokeless tobacco. He reports that he does not drink alcohol and does not use drugs.  No Known Allergies  Family History  Problem Relation Age of Onset   Diabetes Mother     Prior to Admission medications   Medication Sig Start Date End Date Taking? Authorizing Provider  acetaminophen (TYLENOL) 325 MG tablet Take 2 tablets (650 mg total) by mouth every 6 (six) hours as needed for mild pain or moderate pain. Patient not taking: Reported on 10/26/2018 06/14/16   Waynetta Pean, PA-C  aspirin 81 MG tablet Take 81 mg by mouth daily.    [provider]  esomeprazole (NEXIUM) 40 MG capsule Take 40 mg by mouth daily before breakfast.     [provider]  lisinopril-hydrochlorothiazide (PRINZIDE,ZESTORETIC) 20-25 MG per tablet Take 1 tablet by mouth daily.    [provider]  Multiple Vitamin (MULTIVITAMIN) tablet Take 1 tablet by mouth daily.    [provider]  naproxen sodium (ALEVE) 220 MG tablet Take 220-440 mg by mouth 2 (two) times daily as needed (for pain).  [provider]  simvastatin (ZOCOR) 20 MG tablet Take 20 mg by mouth at bedtime.     [provider]  vitamin B-12 (CYANOCOBALAMIN) 100 MCG tablet Take 100 mcg by mouth daily.    [provider]    Physical Exam: Vitals:   09/03/22 1800 09/03/22 1833 09/03/22 1833 09/03/22 1915  BP: 119/64   (!) 121/59  Pulse: (!) 54 (!) 59  (!) 57  Resp: 16 18  (!) 0  Temp:   98 F (36.7 C)   TempSrc:   Oral   SpO2: 100% 100%  99%  Weight:      Height:       Constitutional: NAD, calm, comfortable, elderly male laying in bed asleep Eyes: lids and conjunctivae normal ENMT: Mucous membranes are moist.  Neck: normal, supple Respiratory: clear to  auscultation bilaterally, no wheezing, no crackles. Normal respiratory effort. No accessory muscle use.  Cardiovascular: Regular rate and rhythm, no murmurs / rubs / gallops.  Trace bilateral nonpitting edema of ankles  abdomen: no tenderness,  Bowel sounds positive.  Musculoskeletal: no clubbing / cyanosis. No joint deformity upper and lower extremities. Good ROM, no contractures. Normal muscle tone.  Skin: no rashes, lesions, ulcers.  Neurologic: CN 2-12 grossly intact.   Psychiatric: Alert and oriented only to self and place.  Not able to recognize his son at bedside.  Normal mood. Data Reviewed:  See HPI  Assessment and Plan: * Syncope - Patient presents with syncopal episode with no known cardiac history.  He was recently started on Seroquel and wife gave him a dose earlier this morning prior to his syncope which likely could be the cause.  However, will obtain further work-up with echocardiogram and keep on continuous telemetry overnight. -Lab work otherwise is unrevealing without any infectious etiology.  Hyperlipidemia Continue statin  Dementia without behavioral disturbance (Cody Contreras) -Baseline patient is alert and oriented only to self and place Continue donezepil, citalopram -Hold Seroquel for now since he is already received a dose earlier this morning and could be potential cause of his syncope  Essential hypertension Controlled.  Continue home amlodipine, lisinopril-HCTZ      Advance Care Planning:   Code Status: Full Code   Consults: None  Family Communication: Discussed with wife and son at bedside  Severity of Illness: The appropriate patient status for this patient is OBSERVATION. Observation status is judged to be reasonable and necessary in order to provide the required intensity of service to ensure the patient's safety. The patient's presenting symptoms, physical exam findings, and initial radiographic and laboratory data in the context of their medical condition  is felt to place them at decreased risk for further clinical deterioration. Furthermore, it is anticipated that the patient will be medically stable for discharge from the hospital within 2 midnights of admission.   Author: Orene Desanctis, DO 09/03/2022 8:38 PM  For on call review [email or web address].

## 2022-09-03 NOTE — ED Provider Notes (Signed)
[facility] EMERGENCY DEPARTMENT Provider Note   CSN: [id number] Arrival date & time: 09/03/22  1412     History {Add pertinent medical, surgical, social history, OB history to HPI:1} Chief Complaint  Patient presents with   Near Syncope    Cody Contreras is a 88 y.o. male.  Pt was sitting on porch today, when felt faint/lightheaded and was noted to have syncopal event.  Family has recently noted increased frequency of urination, and indicates seems more confused after event as compared to his baseline dementia. Pt denies any specific c/o. No headache. No chest pain or discomfort. No palpitations. No sob. No abd pain or nvd. Denies rectal bleeding or melena. No dysuria. No extremity pain or swelling. Clothes/underwear soiled, light brown stool. No anticoagulant use. No trauma reported - was in chair. No fever/chills.   The history is provided by the patient, medical records and the EMS personnel. The history is limited by the condition of the patient.  Near Syncope Pertinent negatives include no chest pain, no abdominal pain, no headaches and no shortness of breath.       Home Medications Prior to Admission medications   Medication Sig Start Date End Date Taking? Authorizing Provider  acetaminophen (TYLENOL) 325 MG tablet Take 2 tablets (650 mg total) by mouth every 6 (six) hours as needed for mild pain or moderate pain. Patient not taking: Reported on 10/26/2018 06/14/16   Waynetta Pean, PA-C  aspirin 81 MG tablet Take 81 mg by mouth daily.    [provider]  esomeprazole (NEXIUM) 40 MG capsule Take 40 mg by mouth daily before breakfast.     [provider]  lisinopril-hydrochlorothiazide (PRINZIDE,ZESTORETIC) 20-25 MG per tablet Take 1 tablet by mouth daily.    [provider]  Multiple Vitamin (MULTIVITAMIN) tablet Take 1 tablet by mouth daily.    [provider]  naproxen sodium (ALEVE) 220 MG tablet Take 220-440 mg by mouth  2 (two) times daily as needed (for pain).    [provider]  simvastatin (ZOCOR) 20 MG tablet Take 20 mg by mouth at bedtime.     [provider]  vitamin B-12 (CYANOCOBALAMIN) 100 MCG tablet Take 100 mcg by mouth daily.    [provider]      Allergies    Patient has no known allergies.    Review of Systems   Review of Systems  Constitutional:  Negative for chills and fever.  HENT:  Negative for sore throat.   Eyes:  Negative for redness and visual disturbance.  Respiratory:  Negative for cough and shortness of breath.   Cardiovascular:  Positive for near-syncope. Negative for chest pain, palpitations and leg swelling.  Gastrointestinal:  Negative for abdominal pain, blood in stool, diarrhea and vomiting.  Genitourinary:  Negative for dysuria and flank pain.  Musculoskeletal:  Negative for back pain and neck pain.  Skin:  Negative for rash.  Neurological:  Negative for speech difficulty, weakness, numbness and headaches.  Hematological:  Does not bruise/bleed easily.  Psychiatric/Behavioral:  Positive for confusion.     Physical Exam Updated Vital Signs BP (!) 107/57 (BP Location: Right Arm)   Pulse (!) 56   Temp 98.9 F (37.2 C) (Oral)   Resp 20   SpO2 100%  Physical Exam Vitals and nursing note reviewed.  Constitutional:      Appearance: Normal appearance. He is well-developed.  HENT:     Head: Atraumatic.     Nose: Nose normal.  Mouth/Throat:     Mouth: Mucous membranes are moist.     Pharynx: Oropharynx is clear.  Eyes:     General: No scleral icterus.    Conjunctiva/sclera: Conjunctivae normal.     Pupils: Pupils are equal, round, and reactive to light.  Neck:     Vascular: No carotid bruit.     Trachea: No tracheal deviation.     Comments: No stiffness or rigidity.  Cardiovascular:     Rate and Rhythm: Normal rate and regular rhythm.     Pulses: Normal pulses.     Heart sounds: Normal heart sounds. No murmur heard.    No  friction rub. No gallop.  Pulmonary:     Effort: Pulmonary effort is normal. No accessory muscle usage or respiratory distress.     Breath sounds: Normal breath sounds.  Abdominal:     General: Bowel sounds are normal. There is no distension.     Palpations: Abdomen is soft. There is no mass.     Tenderness: There is no abdominal tenderness. There is no guarding.  Genitourinary:    Comments: No cva tenderness. Musculoskeletal:        General: No swelling.     Cervical back: Normal range of motion and neck supple. No rigidity or tenderness.     Right lower leg: No edema.     Left lower leg: No edema.     Comments: CTLS spine, non tender, aligned, no step off. Good rom bil extremities without pain or focal bony tenderness..   Skin:    General: Skin is warm and dry.     Findings: No rash.  Neurological:     Mental Status: He is alert.     Comments: Alert, speech clear. GCS 15. Motor/sens grossly intact bil.   Psychiatric:        Mood and Affect: Mood normal.     ED Results / Procedures / Treatments   Labs (all labs ordered are listed, but only abnormal results are displayed) Results for orders placed or performed in visit on 09/11/19  Novel Coronavirus, NAA ([facility])   Specimen: Nasopharyngeal(NP) swabs in vial transport medium   NASOPHARYNGE  TESTING  Result Value Ref Range   SARS-CoV-2, NAA Not Detected Not Detected    EKG None  Radiology No results found.  Procedures Procedures  {Document cardiac monitor, telemetry assessment procedure when appropriate:1}  Medications Ordered in ED Medications - No data to display  ED Course/ Medical Decision Making/ A&P                           Medical Decision Making Amount and/or Complexity of Data Reviewed Labs: ordered. ECG/medicine tests: ordered.   Iv ns. Continuous pulse ox and cardiac monitoring. Labs ordered/sent. Imaging ordered.   Reviewed nursing notes and prior charts for additional history. External  reports reviewed. Additional history from: EMS.   Cardiac monitor: sinus rhythm, rate 60.  Labs reviewed/interpreted by me -   CT reviewed/interpreted by me -    {Document critical care time when appropriate:1} {Document review of labs and clinical decision tools ie heart score, Chads2Vasc2 etc:1}  {Document your independent review of radiology images, and any outside records:1} {Document your discussion with family members, caretakers, and with consultants:1} {Document social determinants of health affecting pt's care:1} {Document your decision making why or why not admission, treatments were needed:1} Final Clinical Impression(s) / ED Diagnoses Final diagnoses:  None    Rx /  DC Orders ED Discharge Orders     None       

## 2022-09-03 NOTE — Assessment & Plan Note (Signed)
Controlled.  Continue home amlodipine, lisinopril-HCTZ

## 2022-09-03 NOTE — ED Provider Notes (Signed)
88 yo male w/ dementia presenting with near syncope on porch at home today.  ECG shows sinus bradycardia HR 50's which is baseline level.  Pending labs, [facility]  Physical Exam  BP 123/65   Pulse (!) 56   Temp 98.9 F (37.2 C) (Oral)   Resp 15   Ht '6\' 1"'$  (1.854 m)   Wt 82 kg   SpO2 100%   BMI 23.85 kg/m   Physical Exam  Procedures  Procedures  ED Course / MDM    Medical Decision Making Amount and/or Complexity of Data Reviewed Labs: ordered. Radiology: ordered. ECG/medicine tests: ordered.  Risk Decision regarding hospitalization.   Patient reassessed.  He remains somnolent.  His wife and son are now at the bedside.  They report that the patient was not exerting himself, that he was simply sweeping leaves and began to feel very lightheaded earlier today.  He has never had a spell like this before.  He has also never had an echocardiogram.  We did discuss 24-hour observation for echocardiogram in the hospital versus outpatient follow-up, and his family would strongly prefer to have this done in the hospital at this time.  We also discussed the possibility this may be a reaction to a new medication, Seroquel, which was started 2 days ago to help with his agitation, particularly at night.  His wife has been dosing it twice daily and he did get a dose earlier today.  Otherwise medical work-up is unremarkable.  I do not see evidence specifically of significant dehydration, anemia, STEMI, UTI, COVID, or other life-threatening condition.  He has remained in a regular sinus bradycardia rhythm on telemetry.       Wyvonnia Dusky, MD 09/03/22 1719

## 2022-09-03 NOTE — Assessment & Plan Note (Addendum)
-  Baseline patient is alert and oriented only to self and place Continue donezepil, citalopram -Hold Seroquel for now since he is already received a dose earlier this morning and could be potential cause of his syncope

## 2022-09-03 NOTE — ED Notes (Signed)
Pt family updated on care and notified that the MD would come by and speak with them about results

## 2022-09-03 NOTE — Assessment & Plan Note (Signed)
-   Patient presents with syncopal episode with no known cardiac history.  He was recently started on Seroquel and wife gave him a dose earlier this morning prior to his syncope which likely could be the cause.  However, will obtain further work-up with echocardiogram and keep on continuous telemetry overnight. -Lab work otherwise is unrevealing without any infectious etiology.

## 2022-09-03 NOTE — ED Notes (Signed)
Transported to CT 

## 2022-09-03 NOTE — ED Triage Notes (Signed)
Pt bib [facility] from home, where he lives with family, after a syncopal event while he was sitting in a chair. Pt did not fall. Pt has a history of dementia and is at baseline now but per family he was more confused and weaker than normal after the event. Only other complaint from family is "excessive urination" over the past week. Pt was incontinent of stool prior to arrival EMS vitals: 114/56, 50HR

## 2022-09-04 ENCOUNTER — Observation Stay (HOSPITAL_BASED_OUTPATIENT_CLINIC_OR_DEPARTMENT_OTHER): Payer: Medicare Other

## 2022-09-04 DIAGNOSIS — I5030 Unspecified diastolic (congestive) heart failure: Secondary | ICD-10-CM | POA: Diagnosis not present

## 2022-09-04 DIAGNOSIS — R55 Syncope and collapse: Secondary | ICD-10-CM | POA: Diagnosis not present

## 2022-09-04 LAB — ECHOCARDIOGRAM COMPLETE
AR max vel: 2.35 cm2
AV Area VTI: 2.56 cm2
AV Area mean vel: 2.41 cm2
AV Mean grad: 5 mmHg
AV Peak grad: 9.1 mmHg
Ao pk vel: 1.51 m/s
Area-P 1/2: 2.6 cm2
Calc EF: 66.2 %
Height: 73 in
S' Lateral: 4 cm
Single Plane A2C EF: 67.3 %
Single Plane A4C EF: 62.3 %
Weight: 2803.2 oz

## 2022-09-04 LAB — CBC
HCT: 35 % — ABNORMAL LOW (ref 39.0–52.0)
Hemoglobin: 12.4 g/dL — ABNORMAL LOW (ref 13.0–17.0)
MCH: 33.1 pg (ref 26.0–34.0)
MCHC: 35.4 g/dL (ref 30.0–36.0)
MCV: 93.3 fL (ref 80.0–100.0)
Platelets: 150 10*3/uL (ref 150–400)
RBC: 3.75 MIL/uL — ABNORMAL LOW (ref 4.22–5.81)
RDW: 12.8 % (ref 11.5–15.5)
WBC: 4.4 10*3/uL (ref 4.0–10.5)
nRBC: 0 % (ref 0.0–0.2)

## 2022-09-04 LAB — GLUCOSE, CAPILLARY: Glucose-Capillary: 95 mg/dL (ref 70–99)

## 2022-09-04 LAB — MRSA NEXT GEN BY PCR, NASAL: MRSA by PCR Next Gen: NOT DETECTED

## 2022-09-04 MED ORDER — ORAL CARE MOUTH RINSE: 15.0000 mL | OROMUCOSAL | Status: DC | PRN

## 2022-09-04 NOTE — Plan of Care (Signed)
  Problem: Activity: Goal: Risk for activity intolerance will decrease Outcome: Progressing   Problem: Coping: Goal: Level of anxiety will decrease Outcome: Progressing   Problem: Pain Managment: Goal: General experience of comfort will improve Outcome: Progressing   Problem: Safety: Goal: Ability to remain free from injury will improve Outcome: Progressing   

## 2022-09-04 NOTE — Evaluation (Signed)
Physical Therapy Evaluation/ Discharge Patient Details Name: Cody Contreras MRN: [id number] DOB: January 16, 1934 Today's Date: 09/04/2022  History of Present Illness  88 yo male admitted 11/17 with syncope. PMhx: dementia, HTN, HLD, GERD, vertigo  Clinical Impression  Pt pleasantly confused, able to walk in hall without physical assist with supervision for safety. Pt oriented to self and month only. Pt with HR 51 at rest with rise to 120 with gait without reports of chest pain, fatigue, sOB, dizziness. Family not present to confirm home setup or PLOF. Pt lives with wife and as long as she can provide supervision at home for cognition and safety pt appropriate to return home. No further acute therapy needs will defer to mobility specialists and recommend daily ambulation.   Supine 151/83 Sitting 130/82 Standing 135/67       Recommendations for follow up therapy are one component of a multi-disciplinary discharge planning process, led by the attending physician.  Recommendations may be updated based on patient status, additional functional criteria and insurance authorization.  Follow Up Recommendations No PT follow up      Assistance Recommended at Discharge Frequent or constant Supervision/Assistance  Patient can return home with the following  Assistance with cooking/housework;A little help with bathing/dressing/bathroom;Direct supervision/assist for financial management;Help with stairs or ramp for entrance;Assist for transportation;Direct supervision/assist for medications management    Equipment Recommendations None recommended by PT  Recommendations for Other Services       Functional Status Assessment Patient has not had a recent decline in their functional status     Precautions / Restrictions Precautions Precautions: Fall Precaution Comments: watch HR      Mobility  Bed Mobility Overal bed mobility: Needs Assistance Bed Mobility: Supine to Sit     Supine to sit:  Supervision     General bed mobility comments: supervision for safety and lines    Transfers Overall transfer level: Needs assistance   Transfers: Sit to/from Stand Sit to Stand: Supervision           General transfer comment: supervision for safety    Ambulation/Gait Ambulation/Gait assistance: Supervision Gait Distance (Feet): 400 Feet Assistive device: None Gait Pattern/deviations: Step-through pattern, Decreased stride length   Gait velocity interpretation: 1.31 - 2.62 ft/sec, indicative of limited community ambulator   General Gait Details: decreased stride with supervision for direction and safety, no need for physical assist  Stairs            Wheelchair Mobility    Modified Rankin (Stroke Patients Only)       Balance Overall balance assessment: Mild deficits observed, not formally tested                                           Pertinent Vitals/Pain Pain Assessment Pain Assessment: No/denies pain    Home Living Family/patient expects to be discharged to:: Private residence Living Arrangements: Spouse/significant other   Type of Home: House Home Access: Level entry       Home Layout: One level Home Equipment: None      Prior Function Prior Level of Function : Needs assist  Cognitive Assist : ADLs (cognitive);Mobility (cognitive)           Mobility Comments: supervision for safety ADLs Comments: pt reports he performs ADLs but also 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, wife unavailable to confirm PLOF     Hand Dominance  Extremity/Trunk Assessment   Upper Extremity Assessment Upper Extremity Assessment: Generalized weakness    Lower Extremity Assessment Lower Extremity Assessment: Generalized weakness    Cervical / Trunk Assessment Cervical / Trunk Assessment: Normal  Communication   Communication: No difficulties  Cognition Arousal/Alertness: Awake/alert Behavior During Therapy:  WFL for tasks assessed/performed Overall Cognitive Status: History of cognitive impairments - at baseline                                          General Comments      Exercises     Assessment/Plan    PT Assessment Patient does not need any further PT services  PT Problem List         PT Treatment Interventions      PT Goals (Current goals can be found in the Care Plan section)  Acute Rehab PT Goals PT Goal Formulation: All assessment and education complete, DC therapy    Frequency       Co-evaluation               AM-PAC PT "6 Clicks" Mobility  Outcome Measure Help needed turning from your back to your side while in a flat bed without using bedrails?: None Help needed moving from lying on your back to sitting on the side of a flat bed without using bedrails?: A Little Help needed moving to and from a bed to a chair (including a wheelchair)?: A Little Help needed standing up from a chair using your arms (e.g., wheelchair or bedside chair)?: A Little Help needed to walk in hospital room?: A Little Help needed climbing 3-5 steps with a railing? : A Little 6 Click Score: 19    End of Session Equipment Utilized During Treatment: Gait belt Activity Tolerance: Patient tolerated treatment well Patient left: in chair;with chair alarm set;with call bell/phone within reach Nurse Communication: Mobility status PT Visit Diagnosis: Other abnormalities of gait and mobility (R26.89)    Time: 0962-8366 PT Time Calculation (min) (ACUTE ONLY): 21 min   Charges:   PT Evaluation $PT Eval Low Complexity: 1 Low          [place], PT Acute Rehabilitation Services Office: [place] 09/04/2022, 11:41 AM

## 2022-09-04 NOTE — Progress Notes (Signed)
  Echocardiogram 2D Echocardiogram has been performed.  Lana Fish 09/04/2022, 10:26 AM

## 2022-09-04 NOTE — Care Management (Signed)
  Transition of Care [facility]) Screening Note   Patient Details  Name: GEOFFREY HYNES Date of Birth: 03-Aug-1934   Transition of Care [facility]) CM/SW Contact:    Bethena Roys, RN Phone Number: 09/04/2022, 12:30 PM    Transition of Care Department [facility]) has reviewed the patient. PT evaluated the patient and no home recommendations noted. PTA patient was home with spouse. Case Manager was asked to call spouse regarding disposition needs. Spouse Geni Bers states the patient has dementia and is trying to leave the house at night and she is not able to sleep. Case Manager discussed calling his family son Edd Arbour to assist at night. Jacqueline's daughter is visiting until Monday to see if she can aide in getting services arranged for the patient. Case Manager provided resources for personal care service agencies and family is aware to review the Medicare.gov listing for ALF- Memory care units. Brochures left at the bedside for personal care agencies-family aware to call today to see if they can arrange this weekend- out of pocket fee. Case Manager explained to family that the patient is custodial care and not eligible for rehab at a skilled facility. Patient is a veteran- information provided to the family for the [place] and for them to call to see if he is service connected. No further needs identified at this time.

## 2022-09-04 NOTE — Discharge Summary (Signed)
Physician Discharge Summary  Cody Contreras YNW:[id number] DOB: 21-Mar-1934 DOA: 09/03/2022  PCP: Lavone Orn, MD  Admit date: 09/03/2022 Discharge date: 09/04/2022  Admitted From: Home Disposition:  Home  Discharge Condition:Stable CODE STATUS:FULL Diet recommendation: Regular  Brief/Interim Summary: Cody Contreras is a 88 y.o. male with medical history significant of dementia, HTM HLD and GERD who presented following syncopal episode. Patient has dementia and is alert and oriented only to self and place. Patient vacuumed and went out to the porch to sweep leaves as he usually does.  He then called out to his wife for help.  She found him slumped over in his chair with his eyes closed and mumbling. On presentation he was hemodynamically stable.  He was recently started on Seroquel.  Hospital course remained stable.  Syncopal work-up was restarted.  Echocardiogram showed EF of 60-65%, no significant valvular abnormalities.  Patient was seen by PT, no follow-up recommended.  Patient is desperately willing to go home.  Called and discussed with wife about discharge planning.  Medically stable for discharge.  Following problems were addressed during his hospitalization:  Syncope Patient presented with syncopal episode with no known cardiac history.  He was recently started on Seroquel . Currently remains hemodynamically stable.  Work-up as above   Hyperlipidemia Continue statin   Dementia without behavioral disturbance ([place]) -Baseline patient is alert and oriented only to self and place Continue donezepil, citalopram -Has been started on Seroquel as an outpatient.  We will continue this medication on discharge. -Wife states she has difficulty taking care of him at home.  TOC consulted who provided appropriate resources.  PT did not recommend follow-up   Essential hypertension Controlled.  Continue home amlodipine, lisinopril-HCTZ  Discharge Diagnoses:  Principal Problem:    Syncope Active Problems:   Essential hypertension   Dementia without behavioral disturbance ([place])   Hyperlipidemia    Discharge Instructions  Discharge Instructions     Diet - low sodium heart healthy   Complete by: As directed    Discharge instructions   Complete by: As directed    1)Please follow up with your PCP in a week   Increase activity slowly   Complete by: As directed       Allergies as of 09/04/2022   No Known Allergies      Medication List     STOP taking these medications    naproxen sodium 220 MG tablet Commonly known as: ALEVE       TAKE these medications    amLODipine 10 MG tablet Commonly known as: NORVASC Take 10 mg by mouth daily.   aspirin 500 MG tablet Take 500 mg by mouth every 6 (six) hours as needed for pain.   citalopram 10 MG tablet Commonly known as: CELEXA Take 10 mg by mouth daily.   donepezil 10 MG tablet Commonly known as: ARICEPT Take 10 mg by mouth at bedtime.   esomeprazole 40 MG capsule Commonly known as: NEXIUM Take 40 mg by mouth daily before breakfast.   lisinopril-hydrochlorothiazide 20-25 MG tablet Commonly known as: ZESTORETIC Take 1 tablet by mouth daily.   multivitamin tablet Take 1 tablet by mouth daily.   QUEtiapine 25 MG tablet Commonly known as: SEROQUEL Take 25-50 mg by mouth at bedtime.   simvastatin 20 MG tablet Commonly known as: ZOCOR Take 20 mg by mouth at bedtime.   tolterodine 2 MG 24 hr capsule Commonly known as: DETROL LA Take 2 mg by mouth daily.  No Known Allergies  Consultations: none   Procedures/Studies: ECHOCARDIOGRAM COMPLETE  Result Date: 09/04/2022    ECHOCARDIOGRAM REPORT   Patient Name:   Cody Contreras Date of Exam: 09/04/2022 Medical Rec #:  [id number]     Height:       73.0 in Accession #:    [id number]    Weight:       175.2 lb Date of Birth:  [birth year]    BSA:          2.034 m Patient Age:    66 years      BP:           131/82 mmHg Patient Gender:  M             HR:           52 bpm. Exam Location:  Inpatient Procedure: 2D Echo Indications:    syncope  History:        Patient has no prior history of Echocardiogram examinations.  Sonographer:    Harvie Junior Referring Phys: [id number] [place] [facility]  Sonographer Comments: Technically difficult study due to poor echo windows. IMPRESSIONS  1. Left ventricular ejection fraction, by estimation, is 60 to 65%. The left ventricle has normal function. The left ventricle has no regional wall motion abnormalities. Left ventricular diastolic parameters are consistent with Grade I diastolic dysfunction (impaired relaxation).  2. Right ventricular systolic function is normal. The right ventricular size is normal. Tricuspid regurgitation signal is inadequate for assessing PA pressure.  3. The mitral valve is normal in structure. No evidence of mitral valve regurgitation. No evidence of mitral stenosis.  4. The aortic valve was not well visualized. There is mild calcification of the aortic valve. Aortic valve regurgitation is not visualized. Aortic valve sclerosis/calcification is present, without any evidence of aortic stenosis. Aortic valve area, by VTI measures 2.56 cm. Aortic valve mean gradient measures 5.0 mmHg. Aortic valve Vmax measures 1.51 m/s.  5. The inferior vena cava is normal in size with greater than 50% respiratory variability, suggesting right atrial pressure of 3 mmHg. FINDINGS  Left Ventricle: Left ventricular ejection fraction, by estimation, is 60 to 65%. The left ventricle has normal function. The left ventricle has no regional wall motion abnormalities. The left ventricular internal cavity size was normal in size. There is  no left ventricular hypertrophy. Left ventricular diastolic parameters are consistent with Grade I diastolic dysfunction (impaired relaxation). Normal left ventricular filling pressure. Right Ventricle: The right ventricular size is normal. No increase in right ventricular wall thickness.  Right ventricular systolic function is normal. Tricuspid regurgitation signal is inadequate for assessing PA pressure. Left Atrium: Left atrial size was normal in size. Right Atrium: Right atrial size was normal in size. Pericardium: There is no evidence of pericardial effusion. Mitral Valve: The mitral valve is normal in structure. No evidence of mitral valve regurgitation. No evidence of mitral valve stenosis. Tricuspid Valve: The tricuspid valve is normal in structure. Tricuspid valve regurgitation is not demonstrated. No evidence of tricuspid stenosis. Aortic Valve: The aortic valve was not well visualized. There is mild calcification of the aortic valve. Aortic valve regurgitation is not visualized. Aortic valve sclerosis/calcification is present, without any evidence of aortic stenosis. Aortic valve mean gradient measures 5.0 mmHg. Aortic valve peak gradient measures 9.1 mmHg. Aortic valve area, by VTI measures 2.56 cm. Pulmonic Valve: The pulmonic valve was normal in structure. Pulmonic valve regurgitation is not visualized. No evidence of pulmonic stenosis. Aorta: The aortic root  is normal in size and structure. Venous: The inferior vena cava is normal in size with greater than 50% respiratory variability, suggesting right atrial pressure of 3 mmHg. IAS/Shunts: No atrial level shunt detected by color flow Doppler.  LEFT VENTRICLE PLAX 2D LVIDd:         5.30 cm     Diastology LVIDs:         4.00 cm     LV e' medial:    6.20 cm/s LV PW:         1.00 cm     LV E/e' medial:  13.5 LV IVS:        1.00 cm     LV e' lateral:   6.64 cm/s LVOT diam:     1.90 cm     LV E/e' lateral: 12.7 LV SV:         94 LV SV Index:   46 LVOT Area:     2.84 cm  LV Volumes (MOD) LV vol d, MOD A2C: 64.9 ml LV vol d, MOD A4C: 86.8 ml LV vol s, MOD A2C: 21.2 ml LV vol s, MOD A4C: 32.7 ml LV SV MOD A2C:     43.7 ml LV SV MOD A4C:     86.8 ml LV SV MOD BP:      51.9 ml RIGHT VENTRICLE RV Basal diam:  3.20 cm RV Mid diam:    2.90 cm RV S  prime:     17.00 cm/s TAPSE (M-mode): 2.2 cm LEFT ATRIUM             Index        RIGHT ATRIUM           Index LA diam:        2.80 cm 1.38 cm/m   RA Area:     15.20 cm LA Vol (A2C):   29.4 ml 14.45 ml/m  RA Volume:   33.10 ml  16.27 ml/m LA Vol (A4C):   37.9 ml 18.63 ml/m LA Biplane Vol: 36.9 ml 18.14 ml/m  AORTIC VALVE                     PULMONIC VALVE AV Area (Vmax):    2.35 cm      PV Vmax:       0.86 m/s AV Area (Vmean):   2.41 cm      PV Peak grad:  3.0 mmHg AV Area (VTI):     2.56 cm AV Vmax:           151.00 cm/s AV Vmean:          104.000 cm/s AV VTI:            0.369 m AV Peak Grad:      9.1 mmHg AV Mean Grad:      5.0 mmHg LVOT Vmax:         125.00 cm/s LVOT Vmean:        88.300 cm/s LVOT VTI:          0.333 m LVOT/AV VTI ratio: 0.90  AORTA Ao Root diam: 3.30 cm MITRAL VALVE MV Area (PHT): 2.60 cm     SHUNTS MV Decel Time: 292 msec     Systemic VTI:  0.33 m MV E velocity: 84.00 cm/s   Systemic Diam: 1.90 cm MV A velocity: 106.00 cm/s MV E/A ratio:  0.79 Fransico Him MD Electronically signed by Fransico Him MD Signature Date/Time: 09/04/2022/11:20:25 AM    Final  CT Head Wo Contrast  Result Date: 09/03/2022 CLINICAL DATA:  Mental status change, unknown cause Syncopal episode. EXAM: CT HEAD WITHOUT CONTRAST TECHNIQUE: Contiguous axial images were obtained from the base of the skull through the vertex without intravenous contrast. RADIATION DOSE REDUCTION: This exam was performed according to the departmental dose-optimization program which includes automated exposure control, adjustment of the mA and/or kV according to patient size and/or use of iterative reconstruction technique. COMPARISON:  Head CT 02/08/2020 FINDINGS: Brain: No evidence of acute infarction, hemorrhage, hydrocephalus, extra-axial collection or mass lesion/mass effect. Stable degree of atrophy from prior exam. Stable degree of periventricular and deep chronic small vessel ischemic change. Remote right basal gangliar  lacunar infarcts again seen. Vascular: Atherosclerosis of skullbase vasculature without hyperdense vessel or abnormal calcification. Skull: No fracture or focal lesion. Sinuses/Orbits: Paranasal sinuses and mastoid air cells are clear. The visualized orbits are unremarkable. Other: None. IMPRESSION: 1. No acute intracranial abnormality. 2. Stable atrophy and chronic small vessel ischemic change. Remote right basal gangliar lacunar infarcts. Electronically Signed   By: Keith Rake M.D.   On: 09/03/2022 15:34      Subjective: Patient seen and examined at bedside today.  Sitting in the chair.  Hemodynamically stable without any complaints.  States he wants to go home now  Discharge Exam: Vitals:   09/04/22 1132 09/04/22 1138  BP:    Pulse: (!) 120 (!) 55  Resp:  17  Temp:  97.7 F (36.5 C)  SpO2:  95%   Vitals:   09/04/22 0309 09/04/22 0736 09/04/22 1132 09/04/22 1138  BP: 136/73 131/82    Pulse: (!) 50 (!) 48 (!) 120 (!) 55  Resp: '20 17  17  '$ Temp: 98 F (36.7 C) 97.6 F (36.4 C)  97.7 F (36.5 C)  TempSrc: Oral Oral  Oral  SpO2: 95% 96%  95%  Weight:      Height:        General: Pt is alert, awake, not in acute distress Cardiovascular: RRR, S1/S2 +, no rubs, no gallops Respiratory: CTA bilaterally, no wheezing, no rhonchi Abdominal: Soft, NT, ND, bowel sounds + Extremities: no edema, no cyanosis    The results of significant diagnostics from this hospitalization (including imaging, microbiology, ancillary and laboratory) are listed below for reference.     Microbiology: Recent Results (from the past 240 hour(s))  Resp Panel by RT-PCR (Flu A&B, Covid) Anterior Nasal Swab     Status: None   Collection Time: 09/03/22  3:34 PM   Specimen: Anterior Nasal Swab  Result Value Ref Range Status   SARS Coronavirus 2 by RT PCR NEGATIVE NEGATIVE Final    Comment: (NOTE) SARS-CoV-2 target nucleic acids are NOT DETECTED.  The SARS-CoV-2 RNA is generally detectable in upper  respiratory specimens during the acute phase of infection. The lowest concentration of SARS-CoV-2 viral copies this assay can detect is 138 copies/mL. A negative result does not preclude SARS-Cov-2 infection and should not be used as the sole basis for treatment or other patient management decisions. A negative result may occur with  improper specimen collection/handling, submission of specimen other than nasopharyngeal swab, presence of viral mutation(s) within the areas targeted by this assay, and inadequate number of viral copies(<138 copies/mL). A negative result must be combined with clinical observations, patient history, and epidemiological information. The expected result is Negative.  Fact Sheet for Patients:  EntrepreneurPulse.com.au  Fact Sheet for Healthcare Providers:  IncredibleEmployment.be  This test is no t yet approved or cleared by  the [employer] and  has been authorized for detection and/or diagnosis of SARS-CoV-2 by FDA under an Emergency Use Authorization (EUA). This EUA will remain  in effect (meaning this test can be used) for the duration of the COVID-19 declaration under Section 564(b)(1) of the Act, 21 U.S.C.section 360bbb-3(b)(1), unless the authorization is terminated  or revoked sooner.       Influenza A by PCR NEGATIVE NEGATIVE Final   Influenza B by PCR NEGATIVE NEGATIVE Final    Comment: (NOTE) The Xpert Xpress SARS-CoV-2/FLU/RSV plus assay is intended as an aid in the diagnosis of influenza from Nasopharyngeal swab specimens and should not be used as a sole basis for treatment. Nasal washings and aspirates are unacceptable for Xpert Xpress SARS-CoV-2/FLU/RSV testing.  Fact Sheet for Patients: EntrepreneurPulse.com.au  Fact Sheet for Healthcare Providers: IncredibleEmployment.be  This test is not yet approved or cleared by the [place] FDA and has been  authorized for detection and/or diagnosis of SARS-CoV-2 by FDA under an Emergency Use Authorization (EUA). This EUA will remain in effect (meaning this test can be used) for the duration of the COVID-19 declaration under Section 564(b)(1) of the Act, 21 U.S.C. section 360bbb-3(b)(1), unless the authorization is terminated or revoked.  Performed at [facility], [place] [address], [place], [place] [address]   MRSA Next Gen by PCR, Nasal     Status: None   Collection Time: 09/04/22 12:51 AM   Specimen: Nasal Mucosa; Nasal Swab  Result Value Ref Range Status   MRSA by PCR Next Gen NOT DETECTED NOT DETECTED Final    Comment: (NOTE) The GeneXpert MRSA Assay (FDA approved for NASAL specimens only), is one component of a comprehensive MRSA colonization surveillance program. It is not intended to diagnose MRSA infection nor to guide or monitor treatment for MRSA infections. Test performance is not FDA approved in patients less than 80 years old. Performed at [facility], [place] [address] [place], [place] [address]      Labs: BNP (last 3 results) No results for input(s): "BNP" in the last 8760 hours. Basic Metabolic Panel: Recent Labs  Lab 09/03/22 1427  NA 136  K 4.0  CL 100  CO2 26  GLUCOSE 139*  BUN 13  CREATININE 1.16  CALCIUM 9.3   Liver Function Tests: Recent Labs  Lab 09/03/22 1427  AST 26  ALT 13  ALKPHOS 58  BILITOT 0.5  PROT 6.6  ALBUMIN 3.6   No results for input(s): "LIPASE", "AMYLASE" in the last 168 hours. No results for input(s): "AMMONIA" in the last 168 hours. CBC: Recent Labs  Lab 09/03/22 1427 09/04/22 0609  WBC 4.0 4.4  HGB 11.6* 12.4*  HCT 33.7* 35.0*  MCV 94.7 93.3  PLT 125* 150   Cardiac Enzymes: No results for input(s): "CKTOTAL", "CKMB", "CKMBINDEX", "TROPONINI" in the last 168 hours. BNP: Invalid input(s): "POCBNP" CBG: Recent Labs  Lab 09/04/22 0600  GLUCAP 95   D-Dimer No results for input(s): "DDIMER" in the  last 72 hours. Hgb A1c No results for input(s): "HGBA1C" in the last 72 hours. Lipid Profile No results for input(s): "CHOL", "HDL", "LDLCALC", "TRIG", "CHOLHDL", "LDLDIRECT" in the last 72 hours. Thyroid function studies No results for input(s): "TSH", "T4TOTAL", "T3FREE", "THYROIDAB" in the last 72 hours.  Invalid input(s): "FREET3" Anemia work up No results for input(s): "VITAMINB12", "FOLATE", "FERRITIN", "TIBC", "IRON", "RETICCTPCT" in the last 72 hours. Urinalysis    Component Value Date/Time   COLORURINE YELLOW 09/03/2022 1609  APPEARANCEUR HAZY (A) 09/03/2022 1609   LABSPEC 1.015 09/03/2022 1609   PHURINE 7.0 09/03/2022 1609   GLUCOSEU NEGATIVE 09/03/2022 1609   HGBUR NEGATIVE 09/03/2022 1609   BILIRUBINUR NEGATIVE 09/03/2022 [place] 09/03/2022 1609   PROTEINUR 30 (A) 09/03/2022 1609   NITRITE NEGATIVE 09/03/2022 1609   LEUKOCYTESUR NEGATIVE 09/03/2022 1609   Sepsis Labs Recent Labs  Lab 09/03/22 1427 09/04/22 0609  WBC 4.0 4.4   Microbiology Recent Results (from the past 240 hour(s))  Resp Panel by RT-PCR (Flu A&B, Covid) Anterior Nasal Swab     Status: None   Collection Time: 09/03/22  3:34 PM   Specimen: Anterior Nasal Swab  Result Value Ref Range Status   SARS Coronavirus 2 by RT PCR NEGATIVE NEGATIVE Final    Comment: (NOTE) SARS-CoV-2 target nucleic acids are NOT DETECTED.  The SARS-CoV-2 RNA is generally detectable in upper respiratory specimens during the acute phase of infection. The lowest concentration of SARS-CoV-2 viral copies this assay can detect is 138 copies/mL. A negative result does not preclude SARS-Cov-2 infection and should not be used as the sole basis for treatment or other patient management decisions. A negative result may occur with  improper specimen collection/handling, submission of specimen other than nasopharyngeal swab, presence of viral mutation(s) within the areas targeted by this assay, and inadequate  number of viral copies(<138 copies/mL). A negative result must be combined with clinical observations, patient history, and epidemiological information. The expected result is Negative.  Fact Sheet for Patients:  EntrepreneurPulse.com.au  Fact Sheet for Healthcare Providers:  IncredibleEmployment.be  This test is no t yet approved or cleared by the [place] FDA and  has been authorized for detection and/or diagnosis of SARS-CoV-2 by FDA under an Emergency Use Authorization (EUA). This EUA will remain  in effect (meaning this test can be used) for the duration of the COVID-19 declaration under Section 564(b)(1) of the Act, 21 U.S.C.section 360bbb-3(b)(1), unless the authorization is terminated  or revoked sooner.       Influenza A by PCR NEGATIVE NEGATIVE Final   Influenza B by PCR NEGATIVE NEGATIVE Final    Comment: (NOTE) The Xpert Xpress SARS-CoV-2/FLU/RSV plus assay is intended as an aid in the diagnosis of influenza from Nasopharyngeal swab specimens and should not be used as a sole basis for treatment. Nasal washings and aspirates are unacceptable for Xpert Xpress SARS-CoV-2/FLU/RSV testing.  Fact Sheet for Patients: EntrepreneurPulse.com.au  Fact Sheet for Healthcare Providers: IncredibleEmployment.be  This test is not yet approved or cleared by the [place] FDA and has been authorized for detection and/or diagnosis of SARS-CoV-2 by FDA under an Emergency Use Authorization (EUA). This EUA will remain in effect (meaning this test can be used) for the duration of the COVID-19 declaration under Section 564(b)(1) of the Act, 21 U.S.C. section 360bbb-3(b)(1), unless the authorization is terminated or revoked.  Performed at [facility], [place] [address], [place], [place] [address]   MRSA Next Gen by PCR, Nasal     Status: None   Collection Time: 09/04/22 12:51 AM   Specimen: Nasal  Mucosa; Nasal Swab  Result Value Ref Range Status   MRSA by PCR Next Gen NOT DETECTED NOT DETECTED Final    Comment: (NOTE) The GeneXpert MRSA Assay (FDA approved for NASAL specimens only), is one component of a comprehensive MRSA colonization surveillance program. It is not intended to diagnose MRSA infection nor to guide or monitor treatment for MRSA infections. Test performance is not  FDA approved in patients less than 82 years old. Performed at [facility], [place] [address], [place], [place] [address]     Please note: You were cared for by a hospitalist during your hospital stay. Once you are discharged, your primary care physician will handle any further medical issues. Please note that NO REFILLS for any discharge medications will be authorized once you are discharged, as it is imperative that you return to your primary care physician (or establish a relationship with a primary care physician if you do not have one) for your post hospital discharge needs so that they can reassess your need for medications and monitor your lab values.    Time coordinating discharge: 40 minutes  SIGNED:   Shelly Coss, MD  [facility] 09/04/2022, 12:38 PM Pager [phone]  If 7PM-7AM, please contact night-coverage [email or web address] Password [email or web address]

## 2022-12-29 DIAGNOSIS — E7800 Pure hypercholesterolemia, unspecified: Secondary | ICD-10-CM | POA: Diagnosis not present

## 2022-12-29 DIAGNOSIS — R7301 Impaired fasting glucose: Secondary | ICD-10-CM | POA: Diagnosis not present

## 2022-12-29 DIAGNOSIS — Z1331 Encounter for screening for depression: Secondary | ICD-10-CM | POA: Diagnosis not present

## 2022-12-29 DIAGNOSIS — Z0000 Encounter for general adult medical examination without abnormal findings: Secondary | ICD-10-CM | POA: Diagnosis not present

## 2022-12-29 DIAGNOSIS — I10 Essential (primary) hypertension: Secondary | ICD-10-CM | POA: Diagnosis not present

## 2022-12-29 DIAGNOSIS — F03B18 Unspecified dementia, moderate, with other behavioral disturbance: Secondary | ICD-10-CM | POA: Diagnosis not present

## 2022-12-29 DIAGNOSIS — K219 Gastro-esophageal reflux disease without esophagitis: Secondary | ICD-10-CM | POA: Diagnosis not present

## 2022-12-29 DIAGNOSIS — R7309 Other abnormal glucose: Secondary | ICD-10-CM | POA: Diagnosis not present

## 2023-06-01 ENCOUNTER — Ambulatory Visit (INDEPENDENT_AMBULATORY_CARE_PROVIDER_SITE_OTHER): Payer: Medicare Other | Admitting: Sports Medicine

## 2023-06-01 ENCOUNTER — Encounter: Payer: Self-pay | Admitting: Sports Medicine

## 2023-06-01 VITALS — BP 118/70 | HR 63 | Temp 97.3°F | Ht 70.5 in | Wt 158.0 lb

## 2023-06-01 DIAGNOSIS — F0390 Unspecified dementia without behavioral disturbance: Secondary | ICD-10-CM | POA: Diagnosis not present

## 2023-06-01 DIAGNOSIS — K219 Gastro-esophageal reflux disease without esophagitis: Secondary | ICD-10-CM | POA: Insufficient documentation

## 2023-06-01 DIAGNOSIS — E43 Unspecified severe protein-calorie malnutrition: Secondary | ICD-10-CM

## 2023-06-01 DIAGNOSIS — I10 Essential (primary) hypertension: Secondary | ICD-10-CM | POA: Diagnosis not present

## 2023-06-01 DIAGNOSIS — I639 Cerebral infarction, unspecified: Secondary | ICD-10-CM | POA: Insufficient documentation

## 2023-06-01 NOTE — Patient Instructions (Signed)
Combination of Alzheimers dementia and  Vascular dementia  He had ct scan  2021  showed old stroke

## 2023-06-01 NOTE — Progress Notes (Signed)
Careteam: Patient Care Team: Venita Sheffield, MD as PCP - General (Internal Medicine)  PLACE OF SERVICE:  [facility] CLINIC  Advanced Directive information Does Patient Have a Medical Advance Directive?: Yes, Type of Advance Directive: Out of facility DNR (pink MOST or yellow form), Pre-existing out of facility DNR order (yellow form or pink MOST form): Pink MOST form placed in chart (order not valid for inpatient use), Does patient want to make changes to medical advance directive?: No - Patient declined  Allergies  Allergen Reactions   Donepezil Other (See Comments)    Hallucinations    Tolterodine Other (See Comments)    Hallucinations and sick     Chief Complaint  Patient presents with   Establish Care    New patient to establish care. Moderate fall risk. Discuss cognition. Discuss possible facility placement. Here with spouse. Scored 5/30 on MMSE and failed clock drawing      HPI: Patient is a 89 y.o. male with history of dementia, HTN, GERD presented to clinic to establish care Accompanied by his wife  They been married for 8 yrs. Wife reports that his memory is getting worse and trying to place him in a nursing facility diagnosed with  dementia 2 yrs ago  Does not  comprehend Wife is the primary care taker  Does not sleep good, mostly Up all night  Sleeps all day on the couch He still thinks he is still working  He thinks his mom is still around and looks for her    Wife states that when he gets up early he can dress himself some times Needs help with  most of the ADLS shower, changing clothes, toileting Has urinary incontinence wears diapers Hallucinates intermittently- seeing deceased sisters and mom   He might get agitated at times but not aggressive No wandering behaviors Family h/o dementia - sister , mom Poor appetite, losing weight  No chocking  Coughs some times while drinking Does not have a living will. Falls- 3 falls in the last 6 months but did  not get hurt Pt did not want to use walker. Having word finding difficulty    CT No acute intracranial abnormality. 2. Stable atrophy and chronic small vessel ischemic change. Remote right basal gangliar lacunar infarcts.     Review of Systems:  Review of Systems  Unable to perform ROS: Dementia  Constitutional:  Negative for chills and fever.  Respiratory:  Negative for cough, sputum production and shortness of breath.   Cardiovascular:  Negative for chest pain and leg swelling.  Gastrointestinal:  Negative for abdominal pain and vomiting.  Genitourinary:  Negative for hematuria.  Musculoskeletal:  Positive for falls.     Past Medical History:  Diagnosis Date   Chest pain    neg nuclear stress [phone] and 2002   Chronic back pain    Colon polyps    DDD (degenerative disc disease)    Diverticulosis    ED (erectile dysfunction)    GERD (gastroesophageal reflux disease)    Hyperlipidemia    Hypertension    History reviewed. No pertinent surgical history. Social History:   reports that he quit smoking about 24 years ago. His smoking use included cigarettes. He started smoking about 34 years ago. He has a 10 pack-year smoking history. He has never used smokeless tobacco. He reports that he does not drink alcohol and does not use drugs.  Family History  Problem Relation Age of Onset   Diabetes Mother    Dementia  Mother    Dementia Sister    Alzheimer's disease Sister    Dementia Sister    Alzheimer's disease Sister     Medications: Patient's Medications  New Prescriptions   No medications on file  Previous Medications   AMLODIPINE (NORVASC) 10 MG TABLET    Take 10 mg by mouth daily.   CITALOPRAM (CELEXA) 10 MG TABLET    Take 10 mg by mouth daily.   ESOMEPRAZOLE (NEXIUM) 40 MG CAPSULE    Take 40 mg by mouth daily before breakfast.    LISINOPRIL-HYDROCHLOROTHIAZIDE (PRINZIDE,ZESTORETIC) 20-25 MG PER TABLET    Take 1 tablet by mouth daily.   SIMVASTATIN (ZOCOR) 20 MG  TABLET    Take 20 mg by mouth at bedtime.   Modified Medications   No medications on file  Discontinued Medications   ASPIRIN 500 MG TABLET    Take 500 mg by mouth every 6 (six) hours as needed for pain.   DONEPEZIL (ARICEPT) 10 MG TABLET    Take 10 mg by mouth at bedtime.   MULTIPLE VITAMIN (MULTIVITAMIN) TABLET    Take 1 tablet by mouth daily.   QUETIAPINE (SEROQUEL) 25 MG TABLET    Take 25-50 mg by mouth at bedtime.   TOLTERODINE (DETROL LA) 2 MG 24 HR CAPSULE    Take 2 mg by mouth daily.    Physical Exam:  Vitals:   06/01/23 1427  BP: 118/70  Pulse: 63  Temp: (!) 97.3 F (36.3 C)  TempSrc: Temporal  SpO2: 99%  Weight: 158 lb (71.7 kg)  Height: 5' 10.5" (1.791 m)   Body mass index is 22.35 kg/m. Wt Readings from Last 3 Encounters:  06/01/23 158 lb (71.7 kg)  09/04/22 175 lb 3.2 oz (79.5 kg)  11/02/18 182 lb (82.6 kg)    Physical Exam Constitutional:      Appearance: Normal appearance.  HENT:     Head: Atraumatic.  Cardiovascular:     Rate and Rhythm: Normal rate and regular rhythm.     Pulses: Normal pulses.     Heart sounds: Normal heart sounds.  Pulmonary:     Effort: Pulmonary effort is normal.     Breath sounds: Normal breath sounds.  Abdominal:     General: There is no distension.     Tenderness: There is no abdominal tenderness. There is no guarding.  Musculoskeletal:        General: No swelling or deformity.  Neurological:     Sensory: No sensory deficit.     Motor: No weakness.      Labs reviewed: Basic Metabolic Panel: Recent Labs    09/03/22 1427  NA 136  K 4.0  CL 100  CO2 26  GLUCOSE 139*  BUN 13  CREATININE 1.16  CALCIUM 9.3   Liver Function Tests: Recent Labs    09/03/22 1427  AST 26  ALT 13  ALKPHOS 58  BILITOT 0.5  PROT 6.6  ALBUMIN 3.6   No results for input(s): "LIPASE", "AMYLASE" in the last 8760 hours. No results for input(s): "AMMONIA" in the last 8760 hours. CBC: Recent Labs    09/03/22 1427 09/04/22 0609   WBC 4.0 4.4  HGB 11.6* 12.4*  HCT 33.7* 35.0*  MCV 94.7 93.3  PLT 125* 150   Lipid Panel: No results for input(s): "CHOL", "HDL", "LDLCALC", "TRIG", "CHOLHDL", "LDLDIRECT" in the last 8760 hours. TSH: No results for input(s): "TSH" in the last 8760 hours. A1C: No results found for: "HGBA1C"   Assessment/Plan  1. Dementia without behavioral disturbance (HCC) FAST stage 7a , explained to wife causes of dementia and its progression  She is interested in placement  FL2 form filled out MMSE 5/30  - CBC With Differential/Platelet - Complete Metabolic Panel with eGFR - TSH - Vitamin B12  2. Gastroesophageal reflux disease without esophagitis  Cont with nexium   3. Essential hypertension At goal Cont with lisinopril, hydrochlorothiazide Will check bmp  4. Unspecified severe protein-calorie malnutrition (HCC) Wife reports that pt is not eating much and lost weight  Will check cbc, bmp, tsh, b12, vit d - Vitamin D, 25-hydroxy   Return in about 4 weeks (around 06/29/2023).:  I spent greater than 60 minutes for the care of this patient in face to face time, chart review, clinical documentation, patient education.

## 2023-06-02 LAB — CBC WITH DIFFERENTIAL/PLATELET
Absolute Monocytes: 397 {cells}/uL (ref 200–950)
Basophils Absolute: 20 {cells}/uL (ref 0–200)
Basophils Relative: 0.7 %
Eosinophils Absolute: 41 {cells}/uL (ref 15–500)
Eosinophils Relative: 1.4 %
HCT: 35.2 % — ABNORMAL LOW (ref 38.5–50.0)
Hemoglobin: 12 g/dL — ABNORMAL LOW (ref 13.2–17.1)
Lymphs Abs: 887 {cells}/uL (ref 850–3900)
MCH: 32.4 pg (ref 27.0–33.0)
MCHC: 34.1 g/dL (ref 32.0–36.0)
MCV: 95.1 fL (ref 80.0–100.0)
MPV: 9.4 fL (ref 7.5–12.5)
Monocytes Relative: 13.7 %
Neutro Abs: 1554 {cells}/uL (ref 1500–7800)
Neutrophils Relative %: 53.6 %
Platelets: 152 10*3/uL (ref 140–400)
RBC: 3.7 10*6/uL — ABNORMAL LOW (ref 4.20–5.80)
RDW: 12.7 % (ref 11.0–15.0)
Total Lymphocyte: 30.6 %
WBC: 2.9 10*3/uL — ABNORMAL LOW (ref 3.8–10.8)

## 2023-06-02 LAB — COMPLETE METABOLIC PANEL WITH GFR
AG Ratio: 1.6 (calc) (ref 1.0–2.5)
ALT: 7 U/L — ABNORMAL LOW (ref 9–46)
AST: 17 U/L (ref 10–35)
Albumin: 4.5 g/dL (ref 3.6–5.1)
Alkaline phosphatase (APISO): 60 U/L (ref 35–144)
BUN: 17 mg/dL (ref 7–25)
CO2: 29 mmol/L (ref 20–32)
Calcium: 9.4 mg/dL (ref 8.6–10.3)
Chloride: 99 mmol/L (ref 98–110)
Creat: 0.94 mg/dL (ref 0.70–1.22)
Globulin: 2.8 g/dL (ref 1.9–3.7)
Glucose, Bld: 90 mg/dL (ref 65–139)
Potassium: 3.8 mmol/L (ref 3.5–5.3)
Sodium: 138 mmol/L (ref 135–146)
Total Bilirubin: 0.4 mg/dL (ref 0.2–1.2)
Total Protein: 7.3 g/dL (ref 6.1–8.1)
eGFR: 79 mL/min/{1.73_m2} (ref >=60)

## 2023-06-02 LAB — VITAMIN B12: Vitamin B-12: 586 pg/mL (ref 200–1100)

## 2023-06-02 LAB — VITAMIN D 25 HYDROXY (VIT D DEFICIENCY, FRACTURES): Vit D, 25-Hydroxy: 33 ng/mL (ref 30–100)

## 2023-06-02 LAB — TSH: TSH: 2.66 m[IU]/L (ref 0.40–4.50)

## 2023-07-05 ENCOUNTER — Ambulatory Visit (INDEPENDENT_AMBULATORY_CARE_PROVIDER_SITE_OTHER): Payer: Medicare Other | Admitting: Sports Medicine

## 2023-07-05 ENCOUNTER — Encounter: Payer: Self-pay | Admitting: Sports Medicine

## 2023-07-05 VITALS — BP 118/70 | HR 61 | Temp 97.8°F | Resp 16 | Ht 70.5 in | Wt 159.0 lb

## 2023-07-05 DIAGNOSIS — R059 Cough, unspecified: Secondary | ICD-10-CM

## 2023-07-05 DIAGNOSIS — F03B0 Unspecified dementia, moderate, without behavioral disturbance, psychotic disturbance, mood disturbance, and anxiety: Secondary | ICD-10-CM | POA: Diagnosis not present

## 2023-07-05 DIAGNOSIS — D72819 Decreased white blood cell count, unspecified: Secondary | ICD-10-CM

## 2023-07-05 DIAGNOSIS — Z23 Encounter for immunization: Secondary | ICD-10-CM | POA: Diagnosis not present

## 2023-07-05 DIAGNOSIS — K219 Gastro-esophageal reflux disease without esophagitis: Secondary | ICD-10-CM | POA: Diagnosis not present

## 2023-07-05 DIAGNOSIS — I10 Essential (primary) hypertension: Secondary | ICD-10-CM

## 2023-07-05 LAB — CBC WITH DIFFERENTIAL/PLATELET
Absolute Monocytes: 358 {cells}/uL (ref 200–950)
Basophils Absolute: 10 {cells}/uL (ref 0–200)
Basophils Relative: 0.3 %
Eosinophils Absolute: 29 {cells}/uL (ref 15–500)
Eosinophils Relative: 0.9 %
HCT: 35.4 % — ABNORMAL LOW (ref 38.5–50.0)
Hemoglobin: 12 g/dL — ABNORMAL LOW (ref 13.2–17.1)
Lymphs Abs: 768 {cells}/uL — ABNORMAL LOW (ref 850–3900)
MCH: 32.3 pg (ref 27.0–33.0)
MCHC: 33.9 g/dL (ref 32.0–36.0)
MCV: 95.2 fL (ref 80.0–100.0)
MPV: 9.2 fL (ref 7.5–12.5)
Monocytes Relative: 11.2 %
Neutro Abs: 2035 {cells}/uL (ref 1500–7800)
Neutrophils Relative %: 63.6 %
Platelets: 138 10*3/uL — ABNORMAL LOW (ref 140–400)
RBC: 3.72 10*6/uL — ABNORMAL LOW (ref 4.20–5.80)
RDW: 13 % (ref 11.0–15.0)
Total Lymphocyte: 24 %
WBC: 3.2 10*3/uL — ABNORMAL LOW (ref 3.8–10.8)

## 2023-07-05 MED ORDER — HYDROCHLOROTHIAZIDE 25 MG PO TABS: 25.0000 mg | ORAL_TABLET | Freq: Every day | ORAL | 3 refills | Status: DC

## 2023-07-05 MED ORDER — SIMVASTATIN 20 MG PO TABS: 20.0000 mg | ORAL_TABLET | Freq: Every day | ORAL | 3 refills | Status: AC

## 2023-07-05 MED ORDER — ESOMEPRAZOLE MAGNESIUM 40 MG PO CPDR
40.0000 mg | DELAYED_RELEASE_CAPSULE | Freq: Every day | ORAL | 3 refills | Status: AC
Start: 2023-07-05 — End: ?

## 2023-07-05 MED ORDER — FLUTICASONE PROPIONATE 50 MCG/ACT NA SUSP: 2.0000 | Freq: Every day | NASAL | 6 refills | Status: DC

## 2023-07-05 MED ORDER — LORATADINE 10 MG PO TABS: 10.0000 mg | ORAL_TABLET | Freq: Every day | ORAL | 11 refills | Status: DC

## 2023-07-05 MED ORDER — AMLODIPINE BESYLATE 10 MG PO TABS
10.0000 mg | ORAL_TABLET | Freq: Every day | ORAL | 3 refills | Status: AC
Start: 2023-07-05 — End: ?

## 2023-07-05 NOTE — Progress Notes (Signed)
Careteam: Patient Care Team: Venita Sheffield, MD as PCP - General (Internal Medicine)  PLACE OF SERVICE:  [facility] CLINIC  Advanced Directive information    Allergies  Allergen Reactions   Donepezil Other (See Comments)    Hallucinations    Tolterodine Other (See Comments)    Hallucinations and sick     Chief Complaint  Patient presents with   Follow-up    New patient 4 week follow up.      HPI: Patient is a 89 y.o. male is here for follow up  Recently established with me  Accompanied by his wife  On last visit pt was given FL2 form, wife informs that they he does not qualify for medicaid   Dementia  Gained 1 pound from last visit  No wandering  Agitation - much better  No coughing while eating Wife helps with shower Pt was on aricept in the past    Cough x few months Dry cough    HTN  On amlodipine, lisinopril, hydrochlorothiazide   Review of Systems:  Review of Systems  Unable to perform ROS: Dementia  Constitutional:  Negative for chills and fever.  HENT:  Negative for congestion and sore throat.   Eyes:  Negative for double vision.  Respiratory:  Positive for cough. Negative for sputum production and shortness of breath.   Cardiovascular:  Negative for chest pain, palpitations and leg swelling.  Gastrointestinal:  Negative for abdominal pain, heartburn and nausea.  Genitourinary:  Negative for dysuria, frequency and hematuria.  Musculoskeletal:  Negative for falls and myalgias.  Neurological:  Negative for dizziness, sensory change and focal weakness.    Past Medical History:  Diagnosis Date   Chest pain    neg nuclear stress [phone] and 2002   Chronic back pain    Colon polyps    DDD (degenerative disc disease)    Diverticulosis    ED (erectile dysfunction)    GERD (gastroesophageal reflux disease)    Hyperlipidemia    Hypertension    History reviewed. No pertinent surgical history. Social History:   reports that he quit smoking  about 24 years ago. His smoking use included cigarettes. He started smoking about 34 years ago. He has a 10 pack-year smoking history. He has never used smokeless tobacco. He reports that he does not drink alcohol and does not use drugs.  Family History  Problem Relation Age of Onset   Diabetes Mother    Dementia Mother    Dementia Sister    Alzheimer's disease Sister    Dementia Sister    Alzheimer's disease Sister     Medications: Patient's Medications  New Prescriptions   No medications on file  Previous Medications   AMLODIPINE (NORVASC) 10 MG TABLET    Take 10 mg by mouth daily.   CITALOPRAM (CELEXA) 10 MG TABLET    Take 10 mg by mouth daily.   ESOMEPRAZOLE (NEXIUM) 40 MG CAPSULE    Take 40 mg by mouth daily before breakfast.    LISINOPRIL-HYDROCHLOROTHIAZIDE (PRINZIDE,ZESTORETIC) 20-25 MG PER TABLET    Take 1 tablet by mouth daily.   SIMVASTATIN (ZOCOR) 20 MG TABLET    Take 20 mg by mouth at bedtime.   Modified Medications   No medications on file  Discontinued Medications   No medications on file    Physical Exam:  There were no vitals filed for this visit. There is no height or weight on file to calculate BMI. Wt Readings from Last 3 Encounters:  06/01/23 158 lb (71.7 kg)  09/04/22 175 lb 3.2 oz (79.5 kg)  11/02/18 182 lb (82.6 kg)    Physical Exam Constitutional:      Appearance: Normal appearance.  HENT:     Head: Normocephalic and atraumatic.  Cardiovascular:     Rate and Rhythm: Normal rate and regular rhythm.     Pulses: Normal pulses.     Heart sounds: Normal heart sounds.  Pulmonary:     Effort: No respiratory distress.     Breath sounds: No stridor. No wheezing or rales.  Abdominal:     General: Bowel sounds are normal. There is no distension.     Palpations: Abdomen is soft.     Tenderness: There is no abdominal tenderness. There is no right CVA tenderness or guarding.  Musculoskeletal:        General: No swelling.  Neurological:     Mental  Status: He is alert. Mental status is at baseline.     Sensory: No sensory deficit.     Motor: No weakness.      Labs reviewed: Basic Metabolic Panel: Recent Labs    09/03/22 1427 06/01/23 1540  NA 136 138  K 4.0 3.8  CL 100 99  CO2 26 29  GLUCOSE 139* 90  BUN 13 17  CREATININE 1.16 0.94  CALCIUM 9.3 9.4  TSH  --  2.66   Liver Function Tests: Recent Labs    09/03/22 1427 06/01/23 1540  AST 26 17  ALT 13 7*  ALKPHOS 58  --   BILITOT 0.5 0.4  PROT 6.6 7.3  ALBUMIN 3.6  --    No results for input(s): "LIPASE", "AMYLASE" in the last 8760 hours. No results for input(s): "AMMONIA" in the last 8760 hours. CBC: Recent Labs    09/03/22 1427 09/04/22 0609 06/01/23 1540  WBC 4.0 4.4 2.9*  NEUTROABS  --   --  1,554  HGB 11.6* 12.4* 12.0*  HCT 33.7* 35.0* 35.2*  MCV 94.7 93.3 95.1  PLT 125* 150 152   Lipid Panel: No results for input(s): "CHOL", "HDL", "LDLCALC", "TRIG", "CHOLHDL", "LDLDIRECT" in the last 8760 hours. TSH: Recent Labs    06/01/23 1540  TSH 2.66   A1C: No results found for: "HGBA1C"   Assessment/Plan   1. Need for influenza vaccination   - Flu Vaccine Trivalent High Dose (Fluad)  2. Leukopenia, unspecified type  Will repeat cbc, informed wife possible causes of low white blood cell count, she is interested in pursuing if repeat test shows low WBC - CBC With Differential/Platelet  3. Moderate dementia without behavioral disturbance, psychotic disturbance, mood disturbance, or anxiety, unspecified dementia type (HCC)  Cont with supportive care Instructed wife to keep him engaged  Increase physical and cognitively engaging activities Wife reported that he would not go to adult day care centers  4. Cough, unspecified type  Will stop lisinopril Will start flonase, claritin  Cont with PPI - fluticasone (FLONASE) 50 MCG/ACT nasal spray; Place 2 sprays into both nostrils daily.  Dispense: 16 g; Refill: 6 - loratadine (CLARITIN) 10 MG tablet;  Take 1 tablet (10 mg total) by mouth daily.  Dispense: 30 tablet; Refill: 11  5. Primary hypertension At goal Will stop lisinopril due to cough  - amLODipine (NORVASC) 10 MG tablet; Take 1 tablet (10 mg total) by mouth daily.  Dispense: 90 tablet; Refill: 3 - hydrochlorothiazide (HYDRODIURIL) 25 MG tablet; Take 1 tablet (25 mg total) by mouth daily.  Dispense: 90 tablet; Refill: 3  6. Gastroesophageal reflux disease, unspecified whether esophagitis present  - esomeprazole (NEXIUM) 40 MG capsule; Take 1 capsule (40 mg total) by mouth daily before breakfast.  Dispense: 90 capsule; Refill: 3  Other orders - simvastatin (ZOCOR) 20 MG tablet; Take 1 tablet (20 mg total) by mouth at bedtime.  Dispense: 90 tablet; Refill: 3   No follow-ups on file.: 3 months    I spent greater than 40 minutes for the care of this patient in face to face time, chart review, clinical documentation, patient education.

## 2023-09-19 ENCOUNTER — Encounter: Payer: Self-pay | Admitting: Nurse Practitioner

## 2023-09-19 ENCOUNTER — Ambulatory Visit (INDEPENDENT_AMBULATORY_CARE_PROVIDER_SITE_OTHER): Payer: Medicare Other | Admitting: Nurse Practitioner

## 2023-09-19 VITALS — BP 136/84 | HR 62 | Temp 97.7°F | Ht 70.5 in | Wt 163.4 lb

## 2023-09-19 DIAGNOSIS — K5904 Chronic idiopathic constipation: Secondary | ICD-10-CM | POA: Diagnosis not present

## 2023-09-19 DIAGNOSIS — F0390 Unspecified dementia without behavioral disturbance: Secondary | ICD-10-CM

## 2023-09-19 NOTE — Progress Notes (Signed)
Careteam: Patient Care Team: Venita Sheffield, MD as PCP - General (Internal Medicine)  PLACE OF SERVICE:  [facility] CLINIC  Advanced Directive information Does Patient Have a Medical Advance Directive?: Yes, Type of Advance Directive: Out of facility DNR (pink MOST or yellow form), Does patient want to make changes to medical advance directive?: No - Patient declined  Allergies  Allergen Reactions   Donepezil Other (See Comments)    Hallucinations    Tolterodine Other (See Comments)    Hallucinations and sick     Chief Complaint  Patient presents with   Acute Visit    Constipation. Gave Enema last night and  Miralax this morning and passed a big clump of stool.      HPI: Patient is a 89 y.o. male due to constipation Pt with dementia Here with wife.  She reports she gave him some miralax this morning and had a clump of DM.  He told his wife his behind was hurting last night and he was not able to have a BM. Had enema last night  Big BM out this morning.  Wife reports rectum was swollen after BM- no pain at this time.  Review of Systems:  Review of Systems  Constitutional:  Negative for chills, fever and weight loss.  HENT:  Negative for tinnitus.   Gastrointestinal:  Positive for constipation. Negative for abdominal pain, diarrhea and heartburn.  Musculoskeletal:  Negative for back pain, falls, joint pain and myalgias.  Skin: Negative.   Neurological:  Negative for dizziness and headaches.  Psychiatric/Behavioral:  Positive for memory loss. Negative for depression. The patient does not have insomnia.     Past Medical History:  Diagnosis Date   Chest pain    neg nuclear stress [phone] and 2002   Chronic back pain    Colon polyps    DDD (degenerative disc disease)    Diverticulosis    ED (erectile dysfunction)    GERD (gastroesophageal reflux disease)    Hyperlipidemia    Hypertension    History reviewed. No pertinent surgical history. Social History:    reports that he quit smoking about 24 years ago. His smoking use included cigarettes. He started smoking about 34 years ago. He has a 10 pack-year smoking history. He has never used smokeless tobacco. He reports that he does not drink alcohol and does not use drugs.  Family History  Problem Relation Age of Onset   Diabetes Mother    Dementia Mother    Dementia Sister    Alzheimer's disease Sister    Dementia Sister    Alzheimer's disease Sister     Medications: Patient's Medications  New Prescriptions   No medications on file  Previous Medications   AMLODIPINE (NORVASC) 10 MG TABLET    Take 1 tablet (10 mg total) by mouth daily.   ESOMEPRAZOLE (NEXIUM) 40 MG CAPSULE    Take 1 capsule (40 mg total) by mouth daily before breakfast.   FLUTICASONE (FLONASE) 50 MCG/ACT NASAL SPRAY    Place 2 sprays into both nostrils daily.   HYDROCHLOROTHIAZIDE (HYDRODIURIL) 25 MG TABLET    Take 1 tablet (25 mg total) by mouth daily.   LORATADINE (CLARITIN) 10 MG TABLET    Take 1 tablet (10 mg total) by mouth daily.   MELATONIN 5 MG TABS    Take 10 mg by mouth at bedtime.   SIMVASTATIN (ZOCOR) 20 MG TABLET    Take 1 tablet (20 mg total) by mouth at bedtime.  Modified  Medications   No medications on file  Discontinued Medications   No medications on file    Physical Exam:  Vitals:   09/19/23 1335  BP: 136/84  Pulse: 62  Temp: 97.7 F (36.5 C)  SpO2: 96%  Weight: 163 lb 6.4 oz (74.1 kg)  Height: 5' 10.5" (1.791 m)   Body mass index is 23.11 kg/m. Wt Readings from Last 3 Encounters:  09/19/23 163 lb 6.4 oz (74.1 kg)  07/05/23 159 lb (72.1 kg)  06/01/23 158 lb (71.7 kg)    Physical Exam Constitutional:      Appearance: Normal appearance.  Abdominal:     General: Bowel sounds are normal. There is no distension.     Palpations: Abdomen is soft.     Tenderness: There is no abdominal tenderness.  Neurological:     Mental Status: He is alert. Mental status is at baseline.  Psychiatric:         Mood and Affect: Mood normal.     Labs reviewed: Basic Metabolic Panel: Recent Labs    06/01/23 1540  NA 138  K 3.8  CL 99  CO2 29  GLUCOSE 90  BUN 17  CREATININE 0.94  CALCIUM 9.4  TSH 2.66   Liver Function Tests: Recent Labs    06/01/23 1540  AST 17  ALT 7*  BILITOT 0.4  PROT 7.3   No results for input(s): "LIPASE", "AMYLASE" in the last 8760 hours. No results for input(s): "AMMONIA" in the last 8760 hours. CBC: Recent Labs    06/01/23 1540 07/05/23 1449  WBC 2.9* 3.2*  NEUTROABS 1,554 2,035  HGB 12.0* 12.0*  HCT 35.2* 35.4*  MCV 95.1 95.2  PLT 152 138*   Lipid Panel: No results for input(s): "CHOL", "HDL", "LDLCALC", "TRIG", "CHOLHDL", "LDLDIRECT" in the last 8760 hours. TSH: Recent Labs    06/01/23 1540  TSH 2.66   A1C: No results found for: "HGBA1C"   Assessment/Plan 1. Chronic idiopathic constipation -had improvement with enema and miralax Continue miralax daily, can decrease to half dose or to take every other day -increase hydration  2. Dementia without behavioral disturbance [facility]) -wife reports some caregiver strain, she has some assistance but reports she needs more. He does not like to leave the house and when she takes him out he does not like to go back in because he does not remember he lives there.  - AMB Referral [facility] Care Management to help with support.   To keep follow up with PCP later this month Keimora Swartout K. Biagio Borg [facility] [phone]

## 2023-09-19 NOTE — Patient Instructions (Signed)
To use miralax daily to help keeps bowels moving.  decrease to every other day if stools are loose or can use half dose.   use OTC preparation H as needed if he as hemorrhoids

## 2023-09-20 ENCOUNTER — Telehealth: Payer: Self-pay | Admitting: *Deleted

## 2023-09-20 NOTE — Progress Notes (Signed)
  Care Coordination  Outreach Note  09/20/2023 Name: Cody Contreras MRN: [id number] DOB: November 27, 1933   Care Coordination Outreach Attempts: An unsuccessful telephone outreach was attempted today to offer the patient information about available care coordination services.  Follow Up Plan:  Additional outreach attempts will be made to offer the patient care coordination information and services.   Encounter Outcome:  No Answer  Gwenevere Ghazi  Care Coordination Care Guide  Direct Dial: [phone]

## 2023-09-23 NOTE — Progress Notes (Signed)
  Care Coordination   Note   09/23/2023 Name: Cody Contreras MRN: [id number] DOB: August 19, 1934  Cody Contreras is a 89 y.o. year old male who sees Venita Sheffield, MD for primary care. I reached out to [employer] by phone today to offer care coordination services.  Mr. [facility] was given information about Care Coordination services today including:   The Care Coordination services include support from the care team which includes your Nurse Coordinator, Clinical Social Worker, or Pharmacist.  The Care Coordination team is here to help remove barriers to the health concerns and goals most important to you. Care Coordination services are voluntary, and the patient may decline or stop services at any time by request to their care team member.   Care Coordination Consent Status: Patient agreed to services and verbal consent obtained.   Follow up plan:  Telephone appointment with care coordination team member scheduled for:  09/28/23  Encounter Outcome:  Patient Scheduled  [facility] Coordination Care Guide  Direct Dial: [phone]

## 2023-09-28 ENCOUNTER — Ambulatory Visit: Payer: Self-pay | Admitting: Licensed Clinical Social Worker

## 2023-09-29 NOTE — Patient Instructions (Signed)
Visit Information  Thank you for taking time to visit with me today. Please don't hesitate to contact me if I can be of assistance to you.   Following are the goals we discussed today:   Goals Addressed             This Visit's Progress    Obtain Supportive Resources-Placement   On track    Activities and task to complete in order to accomplish goals.   Keep all upcoming appointments discussed today Continue with compliance of taking medication prescribed by Doctor Implement healthy coping skills discussed to assist with management of symptoms         Our next appointment is by telephone on 12/27 at 3  Please call the care guide team at [phone] if you need to cancel or reschedule your appointment.   If you are experiencing a Mental Health or Behavioral Health Crisis or need someone to talk to, please call the Suicide and Crisis Lifeline: 988 call 911   The patient verbalized understanding of instructions, educational materials, and care plan provided today and DECLINED offer to receive copy of patient instructions, educational materials, and care plan.   Jenel Lucks, MSW, LCSW [facility] Care Management [place]  [facility] [email or web address] Phone [phone] 3:40 PM

## 2023-09-29 NOTE — Patient Outreach (Signed)
  Care Coordination   Initial Visit Note   09/28/2023 Name: Cody Contreras MRN: [id number] DOB: Nov 06, 1933  Cody Contreras is a 89 y.o. year old male who sees Cody Sheffield, MD for primary care. I spoke with  Cody Contreras's spouse, by phone today.  What matters to the patients health and wellness today?  Level of Care and Caregiver Stress    Goals Addressed             This Visit's Progress    Obtain Supportive Resources-Placement   On track    Activities and task to complete in order to accomplish goals.   Keep all upcoming appointments discussed today Continue with compliance of taking medication prescribed by Doctor Implement healthy coping skills discussed to assist with management of symptoms         SDOH assessments and interventions completed:  Yes  SDOH Interventions Today    Flowsheet Row Most Recent Value  SDOH Interventions   Food Insecurity Interventions Intervention Not Indicated  Housing Interventions Intervention Not Indicated  Transportation Interventions Patient Resources (Friends/Family)        Care Coordination Interventions:  Yes, provided  Interventions Today    Flowsheet Row Most Recent Value  Chronic Disease   Chronic disease during today's visit Other  [Dementia]  General Interventions   General Interventions Discussed/Reviewed General Interventions Discussed, Level of Care, Doctor Visits  Doctor Visits Discussed/Reviewed Doctor Visits Discussed  Level of Care Applications, Skilled Nursing Facility, Assisted Living, Adult Daycare  Applications [place]  Education Interventions   Applications [place]  Mental Health Interventions   Mental Health Discussed/Reviewed Mental Health Discussed, Coping Strategies  [Caregiver Stress]  Nutrition Interventions   Nutrition Discussed/Reviewed Nutrition Discussed  Pharmacy Interventions   Pharmacy Dicussed/Reviewed Pharmacy Topics Discussed, Medication Adherence  Safety  Interventions   Safety Discussed/Reviewed Safety Discussed       Follow up plan: Follow up call scheduled for 1-2 weeks    Encounter Outcome:  Patient Visit Completed   Jenel Lucks, MSW, LCSW [facility] Care Management [facility]  [facility] [email or web address] Phone [phone] 3:39 PM

## 2023-10-04 ENCOUNTER — Ambulatory Visit (INDEPENDENT_AMBULATORY_CARE_PROVIDER_SITE_OTHER): Payer: Medicare Other | Admitting: Sports Medicine

## 2023-10-04 ENCOUNTER — Encounter: Payer: Self-pay | Admitting: Sports Medicine

## 2023-10-04 VITALS — BP 130/80 | HR 78 | Temp 97.1°F | Resp 16 | Ht 70.5 in | Wt 162.2 lb

## 2023-10-04 DIAGNOSIS — Z23 Encounter for immunization: Secondary | ICD-10-CM

## 2023-10-04 DIAGNOSIS — K219 Gastro-esophageal reflux disease without esophagitis: Secondary | ICD-10-CM

## 2023-10-04 DIAGNOSIS — G309 Alzheimer's disease, unspecified: Secondary | ICD-10-CM

## 2023-10-04 DIAGNOSIS — F02818 Dementia in other diseases classified elsewhere, unspecified severity, with other behavioral disturbance: Secondary | ICD-10-CM

## 2023-10-04 DIAGNOSIS — I10 Essential (primary) hypertension: Secondary | ICD-10-CM | POA: Diagnosis not present

## 2023-10-04 NOTE — Progress Notes (Signed)
Careteam: Patient Care Team: Venita Sheffield, MD as PCP - General (Internal Medicine) Bridgett Larsson, LCSW as Social Worker (Licensed Clinical Social Worker)  PLACE OF SERVICE:  [facility]  Advanced Directive information Does Patient Have a Medical Advance Directive?: No, Does patient want to make changes to medical advance directive?: No - Patient declined  Allergies  Allergen Reactions   Donepezil Other (See Comments)    Hallucinations    Tolterodine Other (See Comments)    Hallucinations and sick     Chief Complaint  Patient presents with   Medical Management of Chronic Issues    3 month follow up.    Immunizations    Discuss the need for Covid Booster, Shingrix vaccine, and Pne vaccine.      HPI: Patient is a 89 y.o. male is here for follow up  Accompanied by his wife Dementia  Wife reports that he has good day and bad days He wakes up in the middle of the night - some nights Takes day time naps  Wife states that she cannot keep him awake during the day  He uses cane No recent falls Poor appetite  Gained few  pounds last visit  Hallucinating - seeing  people, talks to TV Gets agitated at times, manageable as per his wife Wife helps with adls  Pt is able to feed himself She is the primary caretaker and going through caregiver stress She is trying to place him in a facility  HTN  130/80  On amlodipine, hydrochlorothiazide    Latest Ref Rng & Units 06/01/2023    3:40 PM 09/03/2022    2:27 PM 10/26/2018    1:59 PM  BMP  Glucose 65 - 139 mg/dL 90  409  93   BUN 7 - 25 mg/dL 17  13  15    Creatinine 0.70 - 1.22 mg/dL 8.11  9.14  7.82   BUN/Creat Ratio 6 - 22 (calc) SEE NOTE:     Sodium 135 - 146 mmol/L 138  136  139   Potassium 3.5 - 5.3 mmol/L 3.8  4.0  3.6   Chloride 98 - 110 mmol/L 99  100  102   CO2 20 - 32 mmol/L 29  26  27    Calcium 8.6 - 10.3 mg/dL 9.4  9.3  9.1      Constipation  Moving his bowels everyday  Denies bloody stools  HLD   On simvastatin   Review of Systems:  Review of Systems  Unable to perform ROS: Dementia  Constitutional:  Negative for fever.  HENT:  Negative for congestion.   Respiratory:  Negative for cough, sputum production and shortness of breath.   Cardiovascular:  Negative for palpitations and leg swelling.  Gastrointestinal:  Negative for abdominal pain and vomiting.  Genitourinary:  Negative for dysuria and hematuria.  Musculoskeletal:  Negative for falls.  Psychiatric/Behavioral:  Positive for hallucinations and memory loss.    Negative unless indicated in HPI.   Past Medical History:  Diagnosis Date   Chest pain    neg nuclear stress [phone] and 2002   Chronic back pain    Colon polyps    DDD (degenerative disc disease)    Diverticulosis    ED (erectile dysfunction)    GERD (gastroesophageal reflux disease)    Hyperlipidemia    Hypertension    No past surgical history on file. Social History:   reports that he quit smoking about 24 years ago. His smoking use included cigarettes. He started  smoking about 34 years ago. He has a 10 pack-year smoking history. He has never used smokeless tobacco. He reports that he does not drink alcohol and does not use drugs.  Family History  Problem Relation Age of Onset   Diabetes Mother    Dementia Mother    Dementia Sister    Alzheimer's disease Sister    Dementia Sister    Alzheimer's disease Sister     Medications: Patient's Medications  New Prescriptions   No medications on file  Previous Medications   AMLODIPINE (NORVASC) 10 MG TABLET    Take 1 tablet (10 mg total) by mouth daily.   ESOMEPRAZOLE (NEXIUM) 40 MG CAPSULE    Take 1 capsule (40 mg total) by mouth daily before breakfast.   FLUTICASONE (FLONASE) 50 MCG/ACT NASAL SPRAY    Place 2 sprays into both nostrils daily.   HYDROCHLOROTHIAZIDE (HYDRODIURIL) 25 MG TABLET    Take 1 tablet (25 mg total) by mouth daily.   LORATADINE (CLARITIN) 10 MG TABLET    Take 1 tablet (10 mg  total) by mouth daily.   MELATONIN 5 MG TABS    Take 10 mg by mouth at bedtime.   SIMVASTATIN (ZOCOR) 20 MG TABLET    Take 1 tablet (20 mg total) by mouth at bedtime.  Modified Medications   No medications on file  Discontinued Medications   No medications on file    Physical Exam: Vitals:   10/04/23 1415  BP: 130/80  Pulse: 78  Resp: 16  Temp: (!) 97.1 F (36.2 C)  SpO2: 98%  Weight: 162 lb 3.2 oz (73.6 kg)  Height: 5' 10.5" (1.791 m)   Body mass index is 22.94 kg/m. BP Readings from Last 3 Encounters:  10/04/23 130/80  09/19/23 136/84  07/05/23 118/70   Wt Readings from Last 3 Encounters:  10/04/23 162 lb 3.2 oz (73.6 kg)  09/19/23 163 lb 6.4 oz (74.1 kg)  07/05/23 159 lb (72.1 kg)    Physical Exam Constitutional:      Appearance: Normal appearance.  HENT:     Head: Normocephalic and atraumatic.  Cardiovascular:     Rate and Rhythm: Normal rate and regular rhythm.     Pulses: Normal pulses.     Heart sounds: Normal heart sounds.  Pulmonary:     Effort: No respiratory distress.     Breath sounds: No stridor. No wheezing or rales.  Abdominal:     General: Bowel sounds are normal. There is no distension.     Palpations: Abdomen is soft.     Tenderness: There is no abdominal tenderness. There is no right CVA tenderness or guarding.  Musculoskeletal:        General: No swelling.  Neurological:     Mental Status: He is alert. Mental status is at baseline.     Sensory: No sensory deficit.     Motor: No weakness.     Labs reviewed: Basic Metabolic Panel: Recent Labs    06/01/23 1540  NA 138  K 3.8  CL 99  CO2 29  GLUCOSE 90  BUN 17  CREATININE 0.94  CALCIUM 9.4  TSH 2.66   Liver Function Tests: Recent Labs    06/01/23 1540  AST 17  ALT 7*  BILITOT 0.4  PROT 7.3   No results for input(s): "LIPASE", "AMYLASE" in the last 8760 hours. No results for input(s): "AMMONIA" in the last 8760 hours. CBC: Recent Labs    06/01/23 1540  07/05/23 1449  WBC  2.9* 3.2*  NEUTROABS 1,554 2,035  HGB 12.0* 12.0*  HCT 35.2* 35.4*  MCV 95.1 95.2  PLT 152 138*   Lipid Panel: No results for input(s): "CHOL", "HDL", "LDLCALC", "TRIG", "CHOLHDL", "LDLDIRECT" in the last 8760 hours. TSH: Recent Labs    06/01/23 1540  TSH 2.66   A1C: No results found for: "HGBA1C"   Assessment/Plan 1. Major neurocognitive disorder due to Alzheimer disease, with behavioral disturbance (HCC) (Primary) Functional Assessment Staging Scale: 7a - Ability to speak limited to approximately a half-dozen different intelligible words or fewer in an average day or the course of an intensive interview.  - Ambulatory referral to Home Health Cont with supportive care Has agitation but manageable at this time  Instructed wife to explore adult day care option and have a routine to his day and keep him active with some engaging activities   2. Primary hypertension At goal  Cont with the same  3. Gastroesophageal reflux disease, unspecified whether esophagitis present Cont with nexium  4. Need for pneumococcal 20-valent conjugate vaccination  - Pneumococcal conjugate vaccine 20-valent (Prevnar 20)   No follow-ups on file.:    30 min Total time spent for obtaining history,  performing a medically appropriate examination and evaluation, reviewing the tests,ordering  tests,  documenting clinical information in the electronic or other health record, independently interpreting results ,care coordination (not separately reported)

## 2023-10-07 ENCOUNTER — Telehealth: Payer: Self-pay | Admitting: Licensed Clinical Social Worker

## 2023-10-07 NOTE — Patient Outreach (Signed)
  Care Coordination   Follow Up Visit Note   10/07/2023 Name: Cody Contreras MRN: [id number] DOB: 1934-06-09  Cody Contreras is a 89 y.o. year old male who sees Venita Sheffield, MD for primary care. I engaged with Cody Contreras's spouse in the providers office today.  What matters to the patients health and wellness today?  Level of Care    Goals Addressed             This Visit's Progress    Obtain Supportive Resources-Placement   On track    Activities and task to complete in order to accomplish goals.   Keep all upcoming appointments discussed today Continue with compliance of taking medication prescribed by Doctor Implement healthy coping skills discussed to assist with management of symptoms         SDOH assessments and interventions completed:  No     Care Coordination Interventions:  Yes, provided  Interventions Today    Flowsheet Row Most Recent Value  Chronic Disease   Chronic disease during today's visit Other  [Dementia]  General Interventions   General Interventions Discussed/Reviewed General Interventions Reviewed, Community Resources, Level of Care  [Spouse reports she has not received community resources. LCSW apologized and will have letter with resources sent out today. HH scheduled to visit home on 12/22 for PT]  Doctor Visits Discussed/Reviewed Doctor Visits Reviewed  Level of Care Adult Daycare, Assisted Living  Mental Health Interventions   Mental Health Discussed/Reviewed Mental Health Reviewed  Safety Interventions   Safety Discussed/Reviewed Safety Reviewed       Follow up plan: Follow up call scheduled for 10/14/23    Encounter Outcome:  Patient Visit Completed   Jenel Lucks, MSW, LCSW [facility] Care Management [facility]  [facility] [email or web address] Phone [phone] 9:18 AM

## 2023-10-07 NOTE — Patient Instructions (Signed)
Visit Information  Thank you for taking time to visit with me today. Please don't hesitate to contact me if I can be of assistance to you.   Following are the goals we discussed today:   Goals Addressed             This Visit's Progress    Obtain Supportive Resources-Placement   On track    Activities and task to complete in order to accomplish goals.   Keep all upcoming appointments discussed today Continue with compliance of taking medication prescribed by Doctor Implement healthy coping skills discussed to assist with management of symptoms         Our next appointment is by telephone on 12/27 at 3 PM  Please call the care guide team at [phone] if you need to cancel or reschedule your appointment.   If you are experiencing a Mental Health or Behavioral Health Crisis or need someone to talk to, please call the Suicide and Crisis Lifeline: 988 call 911   The patient verbalized understanding of instructions, educational materials, and care plan provided today and DECLINED offer to receive copy of patient instructions, educational materials, and care plan.   Jenel Lucks, MSW, LCSW [facility] Care Management [place]  [facility] [email or web address] Phone [phone] 9:18 AM

## 2023-10-13 ENCOUNTER — Telehealth: Payer: Self-pay

## 2023-10-13 NOTE — Telephone Encounter (Signed)
Cody Contreras with [facility] called requesting verbal orders for PT 1 x 6 weeks for strengthening, gait, and balance.   Per [employer] standing order, verbal order given. Message will be sent to patient's provider as a FYI.

## 2023-10-14 ENCOUNTER — Ambulatory Visit: Payer: Self-pay | Admitting: Licensed Clinical Social Worker

## 2023-10-28 ENCOUNTER — Encounter: Payer: Self-pay | Admitting: Licensed Clinical Social Worker

## 2023-10-28 ENCOUNTER — Telehealth: Payer: Self-pay | Admitting: Licensed Clinical Social Worker

## 2023-10-28 NOTE — Patient Outreach (Signed)
  Care Coordination   10/28/2023 Name: Cody Contreras MRN: [id number] DOB: 1933/12/30   Care Coordination Outreach Attempts:  An unsuccessful outreach was attempted for an appointment today.  Follow Up Plan:  Additional outreach attempts will be made to offer the patient complex care management information and services.   Encounter Outcome:  No Answer   Care Coordination Interventions:  No, not indicated    Rolin Kerns, MSW, LCSW [facility] Care Management [place]  [facility] [email or web address] Phone [phone] 12:35 PM

## 2023-11-08 ENCOUNTER — Telehealth: Payer: Self-pay | Admitting: Licensed Clinical Social Worker

## 2023-11-09 NOTE — Patient Outreach (Signed)
  Care Coordination   Follow Up Visit Note   11/08/2023 Name: AMDREW MCROBIE MRN: [id number] DOB: 1934/05/12  JUVENS MATTON is a 89 y.o. year old male who sees Venita Sheffield, MD for primary care. I spoke with  Viren Schenone Sappington's spouse by phone today.  What matters to the patients health and wellness today?  Level of Care and Caregiver Stress    Goals Addressed             This Visit's Progress    Obtain Supportive Resources-Placement   On track    Activities and task to complete in order to accomplish goals.   Keep all upcoming appointments discussed today Continue with compliance of taking medication prescribed by Doctor Implement healthy coping skills discussed to assist with management of symptoms Review supportive information provided via mail Contact DSS to initiate LTC Medicaid application         SDOH assessments and interventions completed:  No     Care Coordination Interventions:  Yes, provided  Interventions Today    Flowsheet Row Most Recent Value  Chronic Disease   Chronic disease during today's visit Other  [Dementia]  General Interventions   General Interventions Discussed/Reviewed General Interventions Reviewed, [facility], Doctor Visits, Level of Care  [Spouse reports pt's dementia symptoms are worsening. Endorses ongoing difficulty providing sole care to patient]  Doctor Visits Discussed/Reviewed Doctor Visits Reviewed  Level of Care Assisted Living, Applications  [Spouse will reach out to local DSS office to complete LTC MA application]  Applications [place], [place]  Exercise Interventions   Exercise Discussed/Reviewed Exercise Reviewed  [Pt continues to participate in [facility] PT]  Education Interventions   Applications [place]  Mental Health Interventions   Mental Health Discussed/Reviewed Mental Health Reviewed, Coping Strategies  [Caregiver stress endorsed. Validaation and encouragement provided. Strategies to assist with symptoms  discussed]  Safety Interventions   Safety Discussed/Reviewed Safety Reviewed  Advanced Directive Interventions   Advanced Directives Discussed/Reviewed Advanced Directives Reviewed, Advanced Care Planning  [Spouse agreed to speak with lawyer about completing pt's Advanced Directives]       Follow up plan: Follow up call scheduled for 1-2 weeks    Encounter Outcome:  Patient Visit Completed   Jenel Lucks, LCSW [place]  [facility], [facility] Clinical Social Worker Direct Dial: [phone]  Fax: [phone] Website: [email or web address] 4:58 PM

## 2023-11-09 NOTE — Patient Instructions (Signed)
Visit Information  Thank you for taking time to visit with me today. Please don't hesitate to contact me if I can be of assistance to you.   Following are the goals we discussed today:   Goals Addressed             This Visit's Progress    Obtain Supportive Resources-Placement   On track    Activities and task to complete in order to accomplish goals.   Keep all upcoming appointments discussed today Continue with compliance of taking medication prescribed by Doctor Implement healthy coping skills discussed to assist with management of symptoms Review supportive information provided via mail Contact DSS to initiate LTC Medicaid application         Our next appointment is by telephone on 1/27 at 9:30 AM  Please call the care guide team at [phone] if you need to cancel or reschedule your appointment.   If you are experiencing a Mental Health or Behavioral Health Crisis or need someone to talk to, please call the Suicide and Crisis Lifeline: 988 call 911   The patient verbalized understanding of instructions, educational materials, and care plan provided today and DECLINED offer to receive copy of patient instructions, educational materials, and care plan.   Windy Fast [facility] Health  [facility], [facility] Clinical Social Worker Direct Dial: [phone]  Fax: [phone] Website: [email or web address] 4:59 PM

## 2023-11-14 ENCOUNTER — Telehealth: Payer: Self-pay | Admitting: Licensed Clinical Social Worker

## 2023-11-14 ENCOUNTER — Encounter: Payer: Self-pay | Admitting: Licensed Clinical Social Worker

## 2023-11-14 NOTE — Patient Outreach (Signed)
  Care Coordination   11/14/2023 Name: Cody Contreras MRN: [id number] DOB: 10-Apr-1934   Care Coordination Outreach Attempts:  An unsuccessful outreach was attempted for an appointment today. LCSW continued to get a busy signal when calling pt's spouse and mobile number. LCSW was not able to leave a message for a return call  Follow Up Plan:  Additional outreach attempts will be made to offer the patient complex care management information and services.   Encounter Outcome:  No Answer   Care Coordination Interventions:  No, not indicated    Jenel Lucks, LCSW [place]  [facility], [facility] Clinical Social Worker Direct Dial: [phone]  Fax: [phone] Website: [email or web address] 9:54 AM

## 2024-02-07 ENCOUNTER — Ambulatory Visit (INDEPENDENT_AMBULATORY_CARE_PROVIDER_SITE_OTHER): Payer: Medicare Other | Admitting: Sports Medicine

## 2024-02-07 ENCOUNTER — Encounter: Payer: Self-pay | Admitting: Sports Medicine

## 2024-02-07 VITALS — BP 120/70 | HR 91 | Temp 97.2°F | Resp 16 | Ht 70.5 in | Wt 159.6 lb

## 2024-02-07 DIAGNOSIS — F02B0 Dementia in other diseases classified elsewhere, moderate, without behavioral disturbance, psychotic disturbance, mood disturbance, and anxiety: Secondary | ICD-10-CM

## 2024-02-07 DIAGNOSIS — W19XXXA Unspecified fall, initial encounter: Secondary | ICD-10-CM | POA: Diagnosis not present

## 2024-02-07 DIAGNOSIS — R195 Other fecal abnormalities: Secondary | ICD-10-CM | POA: Diagnosis not present

## 2024-02-07 DIAGNOSIS — M1612 Unilateral primary osteoarthritis, left hip: Secondary | ICD-10-CM

## 2024-02-07 DIAGNOSIS — R2689 Other abnormalities of gait and mobility: Secondary | ICD-10-CM

## 2024-02-07 DIAGNOSIS — G301 Alzheimer's disease with late onset: Secondary | ICD-10-CM | POA: Diagnosis not present

## 2024-02-07 NOTE — Progress Notes (Signed)
 Careteam: Patient Care Team: Tye Gall, MD as PCP - General (Internal Medicine) Adriana Albany, LCSW as Social Worker (Licensed Visual merchandiser)  PLACE OF SERVICE:  [facility]  Advanced Directive information Does Patient Have a Medical Advance Directive?: Yes, Type of Advance Directive: Out of facility DNR (pink MOST or yellow form), Does patient want to make changes to medical advance directive?: No - Patient declined  Allergies  Allergen Reactions   Donepezil  Other (See Comments)    Hallucinations    Tolterodine Other (See Comments)    Hallucinations and sick     Chief Complaint  Patient presents with   Medical Management of Chronic Issues    Discuss the need for 4 month follow up. Discuss the need for Covid Booster, Shingrix vaccine, and AWV.    Concern     Patient wife states that patient has been having black stool for 1 month .     Discussed the use of AI scribe software for clinical note transcription with the patient, who gave verbal consent to proceed.  History of Present Illness    Cody Contreras is an 89 year old male who presents with dark stools   Accompanied by his wife   He has been experiencing dark stools for over a month, which began after a period of constipation. The stools are described as dark brown, and he has a bowel movement every day without any loose stools. There is no visible blood in the stools or on wiping, and no blood has been observed in his diaper. He is currently taking stool softeners. No abdominal pain, nausea, vomiting, or dizziness.  Wife reports that he had a fall recently when his leg gave out   He occasionally limps. He also experiences pain in the left l  hip, and back, He takes Tylenol  for pain management.  He has h/o dementia,   he is unable to recall his breakfast and does not know the date, month, or year. He becomes irritated when asked repetitive questions. No difficulty swallowing, and he has a good  appetite, eating without coughing or choking.   Review of Systems:  Review of Systems  Unable to perform ROS: Dementia  Constitutional:  Negative for fever.  Respiratory:  Negative for cough and sputum production.   Gastrointestinal:        Dark stools   Negative unless indicated in HPI.   Past Medical History:  Diagnosis Date   Chest pain    neg nuclear stress [id number] and 2002   Chronic back pain    Colon polyps    DDD (degenerative disc disease)    Diverticulosis    ED (erectile dysfunction)    GERD (gastroesophageal reflux disease)    Hyperlipidemia    Hypertension    No past surgical history on file. Social History:   reports that he quit smoking about 25 years ago. His smoking use included cigarettes. He started smoking about 35 years ago. He has a 10 pack-year smoking history. He has never used smokeless tobacco. He reports that he does not drink alcohol and does not use drugs.  Family History  Problem Relation Age of Onset   Diabetes Mother    Dementia Mother    Dementia Sister    Alzheimer's disease Sister    Dementia Sister    Alzheimer's disease Sister     Medications: Patient's Medications  New Prescriptions   No medications on file  Previous Medications   AMLODIPINE  (NORVASC ) 10  MG TABLET    Take 1 tablet (10 mg total) by mouth daily.   ESOMEPRAZOLE  (NEXIUM ) 40 MG CAPSULE    Take 1 capsule (40 mg total) by mouth daily before breakfast.   FLUTICASONE  (FLONASE ) 50 MCG/ACT NASAL SPRAY    Place 2 sprays into both nostrils daily.   HYDROCHLOROTHIAZIDE  (HYDRODIURIL ) 25 MG TABLET    Take 1 tablet (25 mg total) by mouth daily.   LORATADINE  (CLARITIN ) 10 MG TABLET    Take 1 tablet (10 mg total) by mouth daily.   MELATONIN 5 MG TABS    Take 10 mg by mouth at bedtime.   SIMVASTATIN  (ZOCOR ) 20 MG TABLET    Take 1 tablet (20 mg total) by mouth at bedtime.  Modified Medications   No medications on file  Discontinued Medications   No medications on file     Physical Exam: Vitals:   02/07/24 1104  BP: 120/70  Pulse: 91  Resp: 16  Temp: (!) 97.2 F (36.2 C)  SpO2: 97%  Weight: 159 lb 9.6 oz (72.4 kg)  Height: 5' 10.5" (1.791 m)   Body mass index is 22.58 kg/m. BP Readings from Last 3 Encounters:  02/07/24 120/70  10/04/23 130/80  09/19/23 136/84   Wt Readings from Last 3 Encounters:  02/07/24 159 lb 9.6 oz (72.4 kg)  10/04/23 162 lb 3.2 oz (73.6 kg)  09/19/23 163 lb 6.4 oz (74.1 kg)    Physical Exam Constitutional:      Appearance: Normal appearance.  HENT:     Head: Normocephalic and atraumatic.  Cardiovascular:     Rate and Rhythm: Normal rate and regular rhythm.     Pulses: Normal pulses.     Heart sounds: Normal heart sounds.  Pulmonary:     Effort: No respiratory distress.     Breath sounds: No stridor. No wheezing or rales.  Abdominal:     General: Bowel sounds are normal. There is no distension.     Palpations: Abdomen is soft.     Tenderness: There is no abdominal tenderness. There is no right CVA tenderness or guarding.  Musculoskeletal:        General: No swelling.  Neurological:     Mental Status: He is alert. Mental status is at baseline.     Labs reviewed: Basic Metabolic Panel: Recent Labs    06/01/23 1540  NA 138  K 3.8  CL 99  CO2 29  GLUCOSE 90  BUN 17  CREATININE 0.94  CALCIUM 9.4  TSH 2.66   Liver Function Tests: Recent Labs    06/01/23 1540  AST 17  ALT 7*  BILITOT 0.4  PROT 7.3   No results for input(s): "LIPASE", "AMYLASE" in the last 8760 hours. No results for input(s): "AMMONIA" in the last 8760 hours. CBC: Recent Labs    06/01/23 1540 07/05/23 1449  WBC 2.9* 3.2*  NEUTROABS 1,554 2,035  HGB 12.0* 12.0*  HCT 35.2* 35.4*  MCV 95.1 95.2  PLT 152 138*   Lipid Panel: No results for input(s): "CHOL", "HDL", "LDLCALC", "TRIG", "CHOLHDL", "LDLDIRECT" in the last 8760 hours. TSH: Recent Labs    06/01/23 1540  TSH 2.66   A1C: No results found for:  "HGBA1C"  Assessment and Plan Assessment & Plan   1. Dark stools (Primary) Fobt neg - CBC with Differential/Platelet - Basic Metabolic Panel with eGFR  2. Fall, initial encounter  - Ambulatory referral to Home Health  3. Balance problems  - Ambulatory referral to Home Health  4. Moderate late onset Alzheimer's dementia without behavioral disturbance, psychotic disturbance, mood disturbance, or anxiety (HCC) Cont assistance with ADLS Increase cognitively engaging activities - Ambulatory referral to Home Health  5. Primary osteoarthritis of left hip Take tylenol  prn for pain Will refer to [facility]  - Ambulatory referral to Home Health

## 2024-02-08 LAB — BASIC METABOLIC PANEL WITHOUT GFR
BUN: 15 mg/dL (ref 7–25)
CO2: 31 mmol/L (ref 20–32)
Calcium: 9.5 mg/dL (ref 8.6–10.3)
Chloride: 102 mmol/L (ref 98–110)
Creat: 0.81 mg/dL (ref 0.70–1.22)
Glucose, Bld: 99 mg/dL (ref 65–139)
Potassium: 3.6 mmol/L (ref 3.5–5.3)
Sodium: 140 mmol/L (ref 135–146)

## 2024-02-08 LAB — CBC WITH DIFFERENTIAL/PLATELET
Absolute Lymphocytes: 794 {cells}/uL — ABNORMAL LOW (ref 850–3900)
Absolute Monocytes: 443 {cells}/uL (ref 200–950)
Basophils Absolute: 9 {cells}/uL (ref 0–200)
Basophils Relative: 0.3 %
Eosinophils Absolute: 81 {cells}/uL (ref 15–500)
Eosinophils Relative: 2.6 %
HCT: 34.7 % — ABNORMAL LOW (ref 38.5–50.0)
Hemoglobin: 11.5 g/dL — ABNORMAL LOW (ref 13.2–17.1)
MCH: 32.2 pg (ref 27.0–33.0)
MCHC: 33.1 g/dL (ref 32.0–36.0)
MCV: 97.2 fL (ref 80.0–100.0)
MPV: 9.1 fL (ref 7.5–12.5)
Monocytes Relative: 14.3 %
Neutro Abs: 1773 {cells}/uL (ref 1500–7800)
Neutrophils Relative %: 57.2 %
Platelets: 125 10*3/uL — ABNORMAL LOW (ref 140–400)
RBC: 3.57 10*6/uL — ABNORMAL LOW (ref 4.20–5.80)
RDW: 13.1 % (ref 11.0–15.0)
Total Lymphocyte: 25.6 %
WBC: 3.1 10*3/uL — ABNORMAL LOW (ref 3.8–10.8)

## 2024-02-10 ENCOUNTER — Telehealth: Payer: Self-pay

## 2024-02-10 NOTE — Telephone Encounter (Signed)
 Copied from CRM [id number]. Topic: Clinical - Home Health Verbal Orders >> Feb 10, 2024  2:49 PM Eleanore Grey wrote: Caller/Agency: Polly Brink with [facility] Callback Number: [phone], confidential voicemail   Service Requested: Physical Therapy Frequency: 1 week 8 Any new concerns about the patient? No  Home Health Verbal Orders was given. Tye Gall, MD has been notify   Message sent to Tye Gall, MD

## 2024-02-13 NOTE — Telephone Encounter (Signed)
 noted

## 2024-04-18 DIAGNOSIS — K219 Gastro-esophageal reflux disease without esophagitis: Secondary | ICD-10-CM

## 2024-04-18 DIAGNOSIS — I10 Essential (primary) hypertension: Secondary | ICD-10-CM | POA: Diagnosis not present

## 2024-04-18 DIAGNOSIS — F0280 Dementia in other diseases classified elsewhere without behavioral disturbance: Secondary | ICD-10-CM | POA: Diagnosis not present

## 2024-04-18 DIAGNOSIS — M519 Unspecified thoracic, thoracolumbar and lumbosacral intervertebral disc disorder: Secondary | ICD-10-CM

## 2024-04-18 DIAGNOSIS — Z9181 History of falling: Secondary | ICD-10-CM

## 2024-04-18 DIAGNOSIS — M1612 Unilateral primary osteoarthritis, left hip: Secondary | ICD-10-CM

## 2024-04-18 DIAGNOSIS — G301 Alzheimer's disease with late onset: Secondary | ICD-10-CM | POA: Diagnosis not present

## 2024-04-18 DIAGNOSIS — Z87891 Personal history of nicotine dependence: Secondary | ICD-10-CM

## 2024-04-18 DIAGNOSIS — K5900 Constipation, unspecified: Secondary | ICD-10-CM

## 2024-04-18 DIAGNOSIS — E785 Hyperlipidemia, unspecified: Secondary | ICD-10-CM | POA: Diagnosis not present

## 2024-04-18 DIAGNOSIS — Z8673 Personal history of transient ischemic attack (TIA), and cerebral infarction without residual deficits: Secondary | ICD-10-CM

## 2024-04-23 ENCOUNTER — Telehealth: Payer: Self-pay | Admitting: *Deleted

## 2024-04-23 NOTE — Telephone Encounter (Signed)
 Copied from CRM [id number]. Topic: General - Other >> Apr 23, 2024 10:48 AM Diannia H wrote: Reason for CRM: [facility] is calling about a form that the provider is needing to fill out that was sent via fax, they received the order so disregard that fax but, the signature on the signature log on their needs to be complete and sign it, initial and date it, so medicare wont deny it. Its 4 pages and all of it is explained on the form. This is the 3rd attempt to get this completed.   [facility] callback number is [phone]

## 2024-04-27 NOTE — Telephone Encounter (Signed)
 Signed all the paperwork in my folder today.

## 2024-04-27 NOTE — Telephone Encounter (Signed)
 Noted

## 2024-05-08 ENCOUNTER — Telehealth: Payer: Self-pay

## 2024-05-08 NOTE — Telephone Encounter (Signed)
 Copied from CRM [id number]. Topic: General - Other >> May 08, 2024 12:21 PM Mercer PEDLAR wrote: Reason for CRM: Cody Contreras (Wife) care giver, calling to get more information about respite care options.

## 2024-05-08 NOTE — Telephone Encounter (Signed)
 Tried to return call x 3, each time phone rang busy. I need more information about the nature of call to better assist

## 2024-05-10 NOTE — Telephone Encounter (Signed)
 Primary Information  Source  Contreras, Cody E (Patient)   Subject  Plamondon, Cody Contreras (Patient)   Topic  General - Other    Communication  Reason for CRM: Patient's wife is requesting for a call back. States she needs to speak with someone about a respite program. Last note was from [place], who tried to contact her. Please call [name] back at [phone], which is her cell phone.   Spoke with patients spouse and she was looking into getting patient into respite care for [facility]. [name] would like to know if Dr.Veludandi would fill out a FL2 for respite care

## 2024-05-10 NOTE — Telephone Encounter (Signed)
 [name], MD to Me (Selected Message)     05/10/24 11:58 AM Will do.   FL2 draft routed to [name], MD

## 2024-05-11 ENCOUNTER — Emergency Department (HOSPITAL_COMMUNITY)

## 2024-05-11 ENCOUNTER — Inpatient Hospital Stay (HOSPITAL_COMMUNITY)
Admission: EM | Admit: 2024-05-11 | Discharge: 2024-05-15 | DRG: 683 | Disposition: A | Discharge status: Skilled Nursing Facility | Attending: Internal Medicine | Admitting: Internal Medicine

## 2024-05-11 ENCOUNTER — Telehealth: Payer: Self-pay

## 2024-05-11 ENCOUNTER — Encounter (HOSPITAL_COMMUNITY): Payer: Self-pay | Admitting: Emergency Medicine

## 2024-05-11 DIAGNOSIS — K219 Gastro-esophageal reflux disease without esophagitis: Secondary | ICD-10-CM | POA: Diagnosis present

## 2024-05-11 DIAGNOSIS — R410 Disorientation, unspecified: Secondary | ICD-10-CM | POA: Diagnosis not present

## 2024-05-11 DIAGNOSIS — N529 Male erectile dysfunction, unspecified: Secondary | ICD-10-CM | POA: Diagnosis present

## 2024-05-11 DIAGNOSIS — Z87891 Personal history of nicotine dependence: Secondary | ICD-10-CM

## 2024-05-11 DIAGNOSIS — R1310 Dysphagia, unspecified: Secondary | ICD-10-CM | POA: Diagnosis present

## 2024-05-11 DIAGNOSIS — Z833 Family history of diabetes mellitus: Secondary | ICD-10-CM

## 2024-05-11 DIAGNOSIS — F0390 Unspecified dementia without behavioral disturbance: Secondary | ICD-10-CM | POA: Diagnosis present

## 2024-05-11 DIAGNOSIS — Z515 Encounter for palliative care: Secondary | ICD-10-CM

## 2024-05-11 DIAGNOSIS — G8929 Other chronic pain: Secondary | ICD-10-CM | POA: Diagnosis present

## 2024-05-11 DIAGNOSIS — E860 Dehydration: Secondary | ICD-10-CM | POA: Diagnosis present

## 2024-05-11 DIAGNOSIS — Z8673 Personal history of transient ischemic attack (TIA), and cerebral infarction without residual deficits: Secondary | ICD-10-CM

## 2024-05-11 DIAGNOSIS — I10 Essential (primary) hypertension: Secondary | ICD-10-CM | POA: Diagnosis present

## 2024-05-11 DIAGNOSIS — E785 Hyperlipidemia, unspecified: Secondary | ICD-10-CM | POA: Diagnosis present

## 2024-05-11 DIAGNOSIS — Z79899 Other long term (current) drug therapy: Secondary | ICD-10-CM

## 2024-05-11 DIAGNOSIS — N179 Acute kidney failure, unspecified: Principal | ICD-10-CM | POA: Diagnosis present

## 2024-05-11 DIAGNOSIS — Z781 Physical restraint status: Secondary | ICD-10-CM

## 2024-05-11 DIAGNOSIS — Z820 Family history of epilepsy and other diseases of the nervous system: Secondary | ICD-10-CM

## 2024-05-11 DIAGNOSIS — Z66 Do not resuscitate: Secondary | ICD-10-CM | POA: Diagnosis present

## 2024-05-11 DIAGNOSIS — Z6820 Body mass index (BMI) 20.0-20.9, adult: Secondary | ICD-10-CM

## 2024-05-11 DIAGNOSIS — W19XXXA Unspecified fall, initial encounter: Principal | ICD-10-CM

## 2024-05-11 DIAGNOSIS — R627 Adult failure to thrive: Secondary | ICD-10-CM | POA: Diagnosis present

## 2024-05-11 DIAGNOSIS — M549 Dorsalgia, unspecified: Secondary | ICD-10-CM | POA: Diagnosis present

## 2024-05-11 DIAGNOSIS — B370 Candidal stomatitis: Secondary | ICD-10-CM | POA: Diagnosis present

## 2024-05-11 LAB — CBC WITH DIFFERENTIAL/PLATELET
Abs Immature Granulocytes: 0.01 K/uL (ref 0.00–0.07)
Basophils Absolute: 0 K/uL (ref 0.0–0.1)
Basophils Relative: 0 %
Eosinophils Absolute: 0 K/uL (ref 0.0–0.5)
Eosinophils Relative: 0 %
HCT: 36.2 % — ABNORMAL LOW (ref 39.0–52.0)
Hemoglobin: 12.2 g/dL — ABNORMAL LOW (ref 13.0–17.0)
Immature Granulocytes: 0 %
Lymphocytes Relative: 14 %
Lymphs Abs: 0.5 K/uL — ABNORMAL LOW (ref 0.7–4.0)
MCH: 31.2 pg (ref 26.0–34.0)
MCHC: 33.7 g/dL (ref 30.0–36.0)
MCV: 92.6 fL (ref 80.0–100.0)
Monocytes Absolute: 0.3 K/uL (ref 0.1–1.0)
Monocytes Relative: 8 %
Neutro Abs: 2.6 K/uL (ref 1.7–7.7)
Neutrophils Relative %: 78 %
Platelets: 137 K/uL — ABNORMAL LOW (ref 150–400)
RBC: 3.91 MIL/uL — ABNORMAL LOW (ref 4.22–5.81)
RDW: 12.5 % (ref 11.5–15.5)
WBC: 3.3 K/uL — ABNORMAL LOW (ref 4.0–10.5)
nRBC: 0 % (ref 0.0–0.2)

## 2024-05-11 LAB — URINALYSIS, W/ REFLEX TO CULTURE (INFECTION SUSPECTED)
Bacteria, UA: NONE SEEN
Bilirubin Urine: NEGATIVE
Glucose, UA: NEGATIVE mg/dL
Hgb urine dipstick: NEGATIVE
Ketones, ur: NEGATIVE mg/dL
Leukocytes,Ua: NEGATIVE
Nitrite: NEGATIVE
Protein, ur: NEGATIVE mg/dL
Specific Gravity, Urine: 1.01 (ref 1.005–1.030)
pH: 7 (ref 5.0–8.0)

## 2024-05-11 LAB — COMPREHENSIVE METABOLIC PANEL WITH GFR
ALT: 11 U/L (ref 0–44)
AST: 24 U/L (ref 15–41)
Albumin: 4 g/dL (ref 3.5–5.0)
Alkaline Phosphatase: 63 U/L (ref 38–126)
Anion gap: 11 (ref 5–15)
BUN: 11 mg/dL (ref 8–23)
CO2: 26 mmol/L (ref 22–32)
Calcium: 9.6 mg/dL (ref 8.9–10.3)
Chloride: 104 mmol/L (ref 98–111)
Creatinine, Ser: 0.83 mg/dL (ref 0.61–1.24)
GFR, Estimated: >60 mL/min (ref >60)
Glucose, Bld: 100 mg/dL — ABNORMAL HIGH (ref 70–99)
Potassium: 3.9 mmol/L (ref 3.5–5.1)
Sodium: 141 mmol/L (ref 135–145)
Total Bilirubin: 0.6 mg/dL (ref 0.0–1.2)
Total Protein: 7.2 g/dL (ref 6.5–8.1)

## 2024-05-11 LAB — AMMONIA: Ammonia: <13 umol/L (ref 9–35)

## 2024-05-11 LAB — TSH: TSH: 2.37 u[IU]/mL (ref 0.350–4.500)

## 2024-05-11 MED ORDER — STERILE WATER FOR INJECTION IJ SOLN
INTRAMUSCULAR | Status: AC
  Administered 2024-05-11: 10 mL via BUCCAL
  Filled 2024-05-11: qty 10

## 2024-05-11 MED ORDER — SIMVASTATIN 20 MG PO TABS
20.0000 mg | ORAL_TABLET | Freq: Every day | ORAL | Status: DC
  Administered 2024-05-11: 20 mg via ORAL
  Filled 2024-05-11 (×2): qty 1

## 2024-05-11 MED ORDER — OLANZAPINE 10 MG IM SOLR
5.0000 mg | Freq: Once | INTRAMUSCULAR | Status: AC
  Administered 2024-05-11: 5 mg via INTRAVENOUS
  Filled 2024-05-11: qty 10

## 2024-05-11 MED ORDER — PANTOPRAZOLE SODIUM 40 MG PO TBEC
40.0000 mg | DELAYED_RELEASE_TABLET | Freq: Every day | ORAL | Status: DC
  Administered 2024-05-11 – 2024-05-15 (×5): 40 mg via ORAL
  Filled 2024-05-11 (×5): qty 1

## 2024-05-11 MED ORDER — AMLODIPINE BESYLATE 5 MG PO TABS
10.0000 mg | ORAL_TABLET | Freq: Every day | ORAL | Status: DC
  Administered 2024-05-11 – 2024-05-13 (×3): 10 mg via ORAL
  Filled 2024-05-11 (×3): qty 2

## 2024-05-11 MED ORDER — HYDROCHLOROTHIAZIDE 25 MG PO TABS
25.0000 mg | ORAL_TABLET | Freq: Every day | ORAL | Status: DC
  Administered 2024-05-11 – 2024-05-13 (×3): 25 mg via ORAL
  Filled 2024-05-11: qty 1
  Filled 2024-05-11 (×2): qty 2

## 2024-05-11 NOTE — ED Triage Notes (Signed)
 Pt BIB EMS from home after witnessed fall at home. Pt hit back of head with no LOC, no thinners. Bradycardic at baseline. Hx dementia, A&O to self  BP 142/76 P 46  SpO2 98% CBG 149

## 2024-05-11 NOTE — ED Notes (Signed)
 Patient transported to X-ray

## 2024-05-11 NOTE — Telephone Encounter (Signed)
 Copied from CRM #[id number]. Topic: General - Other >> May 11, 2024  9:13 AM Diannia H wrote: Reason for CRM: Patients wife Lonell called and wanted to inform the provider that the patient is in the hospital, he wasn't responding well kind of in and out and she called 911 and they took him as of 07/25. She can be reached at [phone].

## 2024-05-11 NOTE — ED Notes (Signed)
 Pt to ct

## 2024-05-11 NOTE — ED Notes (Signed)
 Pt calm now and still alert.  Pt output noted

## 2024-05-11 NOTE — ED Notes (Signed)
 Pt to xray

## 2024-05-11 NOTE — ED Provider Notes (Signed)
 [place] EMERGENCY DEPARTMENT AT [facility] Provider Note   CSN: [id number] Arrival date & time: 05/11/24  9080     Patient presents with: Cody Contreras   Cody Contreras is a 89 y.o. male.   The history is provided by the spouse, a relative and medical records. The history is limited by the condition of the patient. No language interpreter was used.  Fall This is a new problem. The current episode started less than 1 hour ago. The problem has not changed since onset.Pertinent negatives include no chest pain, no abdominal pain, no headaches and no shortness of breath. Nothing aggravates the symptoms. Nothing relieves the symptoms. He has tried nothing for the symptoms. The treatment provided no relief.       Prior to Admission medications   Medication Sig Start Date End Date Taking? Authorizing Provider  amLODipine  (NORVASC ) 10 MG tablet Take 1 tablet (10 mg total) by mouth daily. 07/05/23   [name], MD  esomeprazole  (NEXIUM ) 40 MG capsule Take 1 capsule (40 mg total) by mouth daily before breakfast. 07/05/23   [name], MD  fluticasone  (FLONASE ) 50 MCG/ACT nasal spray Place 2 sprays into both nostrils daily. Patient not taking: Reported on 02/07/2024 07/05/23   [name], MD  hydrochlorothiazide  (HYDRODIURIL ) 25 MG tablet Take 1 tablet (25 mg total) by mouth daily. 07/05/23   [name], MD  loratadine  (CLARITIN ) 10 MG tablet Take 1 tablet (10 mg total) by mouth daily. Patient not taking: Reported on 02/07/2024 07/05/23   [name], MD  melatonin 5 MG TABS Take 10 mg by mouth at bedtime.    [provider]  simvastatin  (ZOCOR ) 20 MG tablet Take 1 tablet (20 mg total) by mouth at bedtime. 07/05/23   [name], MD    Allergies: Donepezil  and Tolterodine    Review of Systems  Unable to perform ROS: Dementia (dementia)  Constitutional:  Positive for fatigue. Negative for chills and diaphoresis.  HENT:   Negative for congestion.   Respiratory:  Negative for cough, chest tightness and shortness of breath.   Cardiovascular:  Negative for chest pain.  Gastrointestinal:  Negative for abdominal pain, constipation, diarrhea, nausea and vomiting.  Genitourinary:  Positive for frequency. Negative for dysuria.  Musculoskeletal:  Negative for back pain and neck pain.  Skin:  Negative for rash and wound.  Neurological:  Negative for headaches.  Psychiatric/Behavioral:  Positive for confusion. Negative for agitation.   All other systems reviewed and are negative.   Updated Vital Signs BP 136/84 (BP Location: Right Arm)   Pulse (!) 108   Temp 98.1 F (36.7 C) (Oral)   Resp 16   SpO2 100%   Physical Exam Vitals and nursing note reviewed.  Constitutional:      General: He is not in acute distress.    Appearance: He is well-developed. He is not ill-appearing, toxic-appearing or diaphoretic.  HENT:     Head: Normocephalic and atraumatic.     Nose: No congestion or rhinorrhea.     Mouth/Throat:     Mouth: Mucous membranes are moist.     Pharynx: No oropharyngeal exudate or posterior oropharyngeal erythema.  Eyes:     Extraocular Movements: Extraocular movements intact.     Conjunctiva/sclera: Conjunctivae normal.     Pupils: Pupils are equal, round, and reactive to light.  Cardiovascular:     Rate and Rhythm: Regular rhythm. Tachycardia present.     Heart sounds: No murmur heard. Pulmonary:     Effort:  Pulmonary effort is normal. No respiratory distress.     Breath sounds: Normal breath sounds. No wheezing, rhonchi or rales.  Chest:     Chest wall: No tenderness.  Abdominal:     Palpations: Abdomen is soft.     Tenderness: There is no abdominal tenderness. There is no guarding or rebound.  Musculoskeletal:        General: No swelling or tenderness.     Cervical back: Neck supple. No tenderness.     Right lower leg: No edema.     Left lower leg: No edema.  Skin:    General: Skin is  warm and dry.     Capillary Refill: Capillary refill takes less than 2 seconds.     Findings: No erythema or rash.  Neurological:     Mental Status: He is alert.     Sensory: No sensory deficit.     Motor: No weakness.     (all labs ordered are listed, but only abnormal results are displayed) Labs Reviewed  CBC WITH DIFFERENTIAL/PLATELET - Abnormal; Notable for the following components:      Result Value   WBC 3.3 (*)    RBC 3.91 (*)    Hemoglobin 12.2 (*)    HCT 36.2 (*)    Platelets 137 (*)    Lymphs Abs 0.5 (*)    All other components within normal limits  COMPREHENSIVE METABOLIC PANEL WITH GFR - Abnormal; Notable for the following components:   Glucose, Bld 100 (*)    All other components within normal limits  URINALYSIS, W/ REFLEX TO CULTURE (INFECTION SUSPECTED)  TSH  AMMONIA    EKG: EKG Interpretation Date/Time:  Friday May 11 2024 13:26:39 EDT Ventricular Rate:  48 PR Interval:  176 QRS Duration:  64 QT Interval:  446 QTC Calculation: 398 R Axis:   111  Text Interpretation: Sinus bradycardia Septal infarct , age undetermined Lateral infarct , age undetermined Abnormal ECG No previous ECGs available when com,pared to prior, similar appearance with bradycardia No STEMI Confirmed by Ginger Barefoot ([id number]) on 05/11/2024 3:10:58 PM  Radiology: [facility] Lumbar Spine Complete Result Date: 05/11/2024 CLINICAL DATA:  Fall. EXAM: LUMBAR SPINE - COMPLETE 4+ VIEW COMPARISON:  06/14/2016. FINDINGS: 5 nonrib bearing lumbar-type vertebral bodies. Diffuse osseous demineralization. Similar dextrocurvature of the thoracolumbar spine. Vertebral body heights are maintained. No acute fracture. No static listhesis. Mild-to-moderate multilevel degenerative disc changes of the mid to lower lumbar spine. IMPRESSION: 1. No acute fracture or traumatic listhesis of the lumbar spine. 2. Mild-to-moderate multilevel degenerative disc changes of the mid to lower lumbar spine. Electronically Signed   By:  Harrietta Sherry M.D.   On: 05/11/2024 15:08   DG Pelvis 1-2 Views Result Date: 05/11/2024 CLINICAL DATA:  Fall. EXAM: PELVIS - 1-2 VIEW COMPARISON:  07/08/2014. FINDINGS: No acute fracture or diastasis. Femoral heads are seated within the acetabula. Serpiginous sclerotic change of the right femoral head is again noted, compatible with AVN. Mild-to-moderate degenerative changes of the bilateral hips. Sacroiliac joints and pubic symphysis are anatomically aligned. Degenerative changes of the visualized lower lumbar spine. IMPRESSION: 1. No acute osseous abnormality. 2. Chronic AVN of the right femoral head. Electronically Signed   By: Harrietta Sherry M.D.   On: 05/11/2024 15:02   DG Chest 2 View Result Date: 05/11/2024 CLINICAL DATA:  Fall, altered mental status EXAM: CHEST - 2 VIEW COMPARISON:  Chest x-ray October 26, 2018 FINDINGS: The heart size and mediastinal contours are within normal limits. No airspace opacity/consolidation.  The visualized skeletal structures are unremarkable. IMPRESSION: No active cardiopulmonary disease. Electronically Signed   By: Megan  Zare M.D.   On: 05/11/2024 15:01   CT Head Wo Contrast Result Date: 05/11/2024 CLINICAL DATA:  Mental status change, unknown cause Altered mental status, confusion, fall, dementia EXAM: CT HEAD WITHOUT CONTRAST TECHNIQUE: Contiguous axial images were obtained from the base of the skull through the vertex without intravenous contrast. RADIATION DOSE REDUCTION: This exam was performed according to the departmental dose-optimization program which includes automated exposure control, adjustment of the mA and/or kV according to patient size and/or use of iterative reconstruction technique. COMPARISON:  September 03, 2022 FINDINGS: Brain: Proportional prominence of the ventricles and sulci, consistent with diffuse cerebral parenchymal volume loss. The ventricles otherwise maintained midline position without midline shift. Gray-white differentiation is  preserved.Confluent periventricular and subcortical white matter hypoattenuation, most consistent with changes of severe chronic ischemic microvascular disease.No evidence of acute territorial infarction, extra-axial fluid collection, hemorrhage, or mass lesion. The basilar cisterns are patent without downward herniation. The cerebellar hemispheres and vermis are well formed without mass lesion or focal attenuation abnormality. Vascular: No hyperdense vessel. Calcified atherosclerotic plaque within the cavernous/supraclinoid ICA and intradural vertebral arteries. Skull: Normal. Negative for fracture or focal lesion. Sinuses/Orbits: The paranasal sinuses and mastoids are clear.The globes appear intact. No retrobulbar hematoma. Other: None. IMPRESSION: 1. No acute intracranial abnormality, specifically, no acute hemorrhage, territorial infarction, or intracranial mass. 2. Global cerebral volume loss with sequelae of advanced chronic ischemic microvascular disease. Electronically Signed   By: Rogelia Myers M.D.   On: 05/11/2024 14:36     Procedures   Medications Ordered in the ED  simvastatin  (ZOCOR ) tablet 20 mg (has no administration in time range)  hydrochlorothiazide  (HYDRODIURIL ) tablet 25 mg (has no administration in time range)  pantoprazole  (PROTONIX ) EC tablet 40 mg (has no administration in time range)  amLODipine  (NORVASC ) tablet 10 mg (has no administration in time range)                                    Medical Decision Making Amount and/or Complexity of Data Reviewed Labs: ordered. Radiology: ordered.  Risk Prescription drug management.    Cody Contreras is a 89 y.o. male with a past medical history significant for previous throat, GERD, hypertension, hyperlipidemia, and dementia who presents with family for several days of worsening altered mental status, urinary frequency, and a fall.  According to family, the patient lives at home with family and over the last several days  has had more confusion.  He is not acting like he normally does and this morning he had a near fall where he was trying to pull down a blind and started to fall backwards.  He did not reportedly hit his head but did get jostled.  He has not had any reported fevers, chills, cough, congestion, nausea vomiting, or bowel changes.  Family says this was not he was acting last week and something is different.  They are concerned about him at home with his mental status change and near falls.  Family was concerned about his hips and his back after his near fall.  On exam, I do not see evidence of acute trauma initially.  Lungs were clear and chest is nontender.  Abdomen nontender.  Patient is moving all extremities but is not answering questions.  Family says he is still more confused than baseline.  Pupils  were symmetric and reactive with normal extract movements.  Clinically I am concerned about the patient's mental status change over the last few days and his urinary frequency.  Will get urinalysis and labs.  Family says that they are concerned he is not safe to be at home in his current state and thinks he needs to be admitted.  Will look for UTI or other acute medical problem.  We will get CT of the head given his near fall and mental status change and will also get x-rays of his lumbar spine, pelvis, and his chest.  Will look for occult infection.  Will get screening labs.  Anticipate admission for mental status change.  If workup is completely reassuring, patient may require social work evaluation for placement based on my discussion with family.  3:21 PM Workup returned without critical abnormality that would need medical admission at this time.  His CBC and CMP repeat similar to prior.  Urinalysis surprisingly had no evidence of infection.  TSH normal.  Imaging did not show significant acute traumatic injury and ammonia is undetectable.  CT head did not show bleeding.  Clinically I do have concern about  the patient going home.  Family was able to say that in the last week patient has had multiple falls when he is tried to leave the house on his own and has gotten outside.  His dementia is seeming to be worsening and I do not see initial criteria for medical admission at this time.  He is felt to medically clear, I will order home medicines, I suspect he will need to be boarded until we can find appropriate level of care and placement needs for him.  Will consult transition of care team in place PT eval and treat consult.     Final diagnoses:  Fall, initial encounter  Dementia, unspecified dementia severity, unspecified dementia type, unspecified whether behavioral, psychotic, or mood disturbance or anxiety (HCC)  Confusion     Clinical Impression: 1. Fall, initial encounter   2. Dementia, unspecified dementia severity, unspecified dementia type, unspecified whether behavioral, psychotic, or mood disturbance or anxiety (HCC)   3. Confusion     Disposition: Patient will be a border and await transitional care evaluation and social work recommendations and PT evaluation.  Home medications ordered.  Patient does not appear to be safe for discharge home based on his home and family situation and this worsening mental status.  Did not find a medical reason to admit at this time.  This note was prepared with assistance of Conservation officer, historic buildings. Occasional wrong-word or sound-a-like substitutions may have occurred due to the inherent limitations of voice recognition software.      Kecia Swoboda, Lonni PARAS, MD 05/11/24 1525

## 2024-05-11 NOTE — TOC Initial Note (Signed)
 Transition of Care [facility]) - Initial/Assessment Note    Patient Details  Name: Cody Contreras MRN: [id number] Date of Birth: 04/09/1934  Transition [facility] CM/SW Contact:    Hartley KATHEE Robertson, LCSWA Phone Number: 05/11/2024, 4:31 PM  Clinical Narrative:                  CSW spoke with pt's spouse [name] by phone, she states she is no longer able to care for pt as his dementia is worsening and is requesting assistance with placement. CSW inquired on a payor source, she states pt does not qualify for Medicaid, they applied recently and was told that their income was too high. Pt's spouse was under the impression that Medicare could be used to pay, CSW advised only LTC insurance, cash and Medicaid would cover LTC, she stated she does not have funds to pay for LTC completely. CSW discussed family members, she states they help when able but no one is able to help 24/7, CSW inquired on whether the family would be able to pay for in home sitter services, pt's spouse said this may be an option. CSW will make a referral to [facility], left vm for Edsel HERO. CSW asked pt's spouse if she would be able to take pt home once services were put in place, she stated she has no choice but would need a plan in place, reiterated she can no longer care for him alone. ICM will continue to follow.        Patient Goals and CMS Choice            Expected Discharge Plan and Services                                              Prior Living Arrangements/Services                       Activities of Daily Living      Permission Sought/Granted                  Emotional Assessment              Admission diagnosis:  fall Patient Active Problem List   Diagnosis Date Noted   GERD (gastroesophageal reflux disease) 06/01/2023   CVA (cerebral vascular accident) (HCC) 06/01/2023   Syncope 09/03/2022   Dementia without behavioral disturbance (HCC) 09/03/2022    Hyperlipidemia 09/03/2022   Essential hypertension 11/15/2016   PCP:  [name], MD Pharmacy:   [id number] [address], [place] - [phone] AT [facility] & [address] [place] [address] Phone: [phone] Fax: [phone]     Social Drivers of Health (SDOH) Social History: SDOH Screenings   Food Insecurity: No Food Insecurity (09/28/2023)  Housing: Low Risk  (09/28/2023)  Transportation Needs: No Transportation Needs (09/28/2023)  Depression (PHQ2-9): Low Risk  (02/07/2024)  Social Connections: Moderately Integrated (02/07/2024)  Tobacco Use: Medium Risk (05/11/2024)   SDOH Interventions:     Readmission Risk Interventions     No data to display

## 2024-05-11 NOTE — ED Notes (Signed)
 Pt was moved back to TCU bed.  Pt was soaked with urine.  Pt had on pull-up that was soaked thru.  His pants and all bed linens that were under him were soaked with urine.  Pt changed and cleaned.  Pt had urine bag w/suction placed and pt put in clean diaper.  Pt was put in dry pants and per wife we threw away his pj pants.  Pt had to be put in mittens so he would not pull on pants or try to get up.  Pts sitter moved into room with pt so he could be monitored more closely.

## 2024-05-12 ENCOUNTER — Emergency Department (HOSPITAL_COMMUNITY)

## 2024-05-12 NOTE — Evaluation (Signed)
 Physical Therapy Evaluation Patient Details Name: Cody Contreras MRN: [id number] DOB: 10-09-1934 Today's Date: 05/12/2024  History of Present Illness  89 y.o. male with a past medical history significant for previous throat, GERD, hypertension, hyperlipidemia, and dementia who presents with family for several days of worsening altered mental status, urinary frequency, and a fall.  According to family, the patient lives at home with family and over the last several days has had more confusion.  Clinical Impression  Pt admitted with above diagnosis. Pt ambulates with a cane independently at baseline. Today he required +2 total assist for sit to stand, and min to mod assist for sitting balance. He was not able to maintain standing balance. He's had a significant decline in mobility status. Pt's wife reports she's not able to care for pt at home.24/7 assistance recommended due to h/o 2 recent falls.  Pt currently with functional limitations due to the deficits listed below (see PT Problem List). Pt will benefit from acute skilled PT to increase their independence and safety with mobility to allow discharge.           If plan is discharge home, recommend the following: Two people to help with walking and/or transfers;A lot of help with bathing/dressing/bathroom;Assistance with cooking/housework;Assist for transportation;Help with stairs or ramp for entrance;Direct supervision/assist for financial management;Direct supervision/assist for medications management   Can travel by private vehicle   No    Equipment Recommendations Hospital bed;Wheelchair (measurements PT);Wheelchair cushion (measurements PT) (if DC home)  Recommendations for Other Services       Functional Status Assessment Patient has had a recent decline in their functional status and demonstrates the ability to make significant improvements in function in a reasonable and predictable amount of time.     Precautions / Restrictions  Precautions Precautions: Fall Recall of Precautions/Restrictions: Impaired Precaution/Restrictions Comments: Falls in past 6 months: 1 fall just prior to admission, 1 other fall a couple weeks ago. Restrictions Weight Bearing Restrictions Per Provider Order: No      Mobility  Bed Mobility Overal bed mobility: Needs Assistance Bed Mobility: Supine to Sit, Rolling, Sit to Supine Rolling: Total assist   Supine to sit: Total assist, +2 for physical assistance Sit to supine: Max assist   General bed mobility comments: assist to raise trunk, pivot hips and advance BLEs over edge of bed; assist for BLEs back into bed    Transfers Overall transfer level: Needs assistance Equipment used: 2 person hand held assist Transfers: Sit to/from Stand Sit to Stand: Max assist, +2 physical assistance, +2 safety/equipment           General transfer comment: +2 max assist for sit to stand, pt not able to maintain standing balance 2* posterior lean    Ambulation/Gait                  Stairs            Wheelchair Mobility     Tilt Bed    Modified Rankin (Stroke Patients Only)       Balance Overall balance assessment: Needs assistance Sitting-balance support: Feet supported, No upper extremity supported Sitting balance-Leahy Scale: Poor Sitting balance - Comments: initially posterior lean requiring mod A, then briefly able to maintain static sitting Postural control: Posterior lean   Standing balance-Leahy Scale: Zero Standing balance comment: +2 HHA  Pertinent Vitals/Pain Pain Assessment Pain Assessment: No/denies pain    Home Living Family/patient expects to be discharged to:: Private residence Living Arrangements: Spouse/significant other Available Help at Discharge: Family;Available 24 hours/day Type of Home: House Home Access: Level entry       Home Layout: One level Home Equipment: Cane - single point       Prior Function               Mobility Comments: walks with SPC; 2 falls in past 6 months ADLs Comments: wife assists with bathing/dressing     Extremity/Trunk Assessment   Upper Extremity Assessment Upper Extremity Assessment: Difficult to assess due to impaired cognition;Overall [facility] for tasks assessed    Lower Extremity Assessment Lower Extremity Assessment: Difficult to assess due to impaired cognition;Overall [facility] for tasks assessed    Cervical / Trunk Assessment Cervical / Trunk Assessment: Normal  Communication   Communication Communication: Impaired Factors Affecting Communication: Difficulty expressing self;Reduced clarity of speech    Cognition Arousal: Alert Behavior During Therapy: Restless   PT - Cognitive impairments: History of cognitive impairments, Memory, Attention, Orientation, Problem solving, Safety/Judgement, Awareness   Orientation impairments: Time, Place, Situation                   PT - Cognition Comments: pt mumbling, did not respond to orientation questions Following commands: Impaired Following commands impaired: Follows one step commands inconsistently     Cueing Cueing Techniques: Verbal cues, Tactile cues, Gestural cues     General Comments      Exercises     Assessment/Plan    PT Assessment Patient needs continued PT services  PT Problem List Decreased mobility;Decreased activity tolerance;Decreased balance;Decreased cognition       PT Treatment Interventions Therapeutic activities;Gait training;Therapeutic exercise;Functional mobility training;Balance training    PT Goals (Current goals can be found in the Care Plan section)  Acute Rehab PT Goals Patient Stated Goal: SNF PT Goal Formulation: With family Time For Goal Achievement: 05/26/24 Potential to Achieve Goals: Fair    Frequency Min 2X/week     Co-evaluation               AM-PAC PT 6 Clicks Mobility  Outcome Measure Help needed turning from  your back to your side while in a flat bed without using bedrails?: Total Help needed moving from lying on your back to sitting on the side of a flat bed without using bedrails?: Total Help needed moving to and from a bed to a chair (including a wheelchair)?: Total Help needed standing up from a chair using your arms (e.g., wheelchair or bedside chair)?: Total Help needed to walk in hospital room?: Total Help needed climbing 3-5 steps with a railing? : Total 6 Click Score: 6    End of Session Equipment Utilized During Treatment: Gait belt Activity Tolerance: Patient tolerated treatment well Patient left: in bed;with family/visitor present;with call bell/phone within reach Nurse Communication: Mobility status PT Visit Diagnosis: Unsteadiness on feet (R26.81);Difficulty in walking, not elsewhere classified (R26.2);History of falling (Z91.81)    Time: 1446-1510 PT Time Calculation (min) (ACUTE ONLY): 24 min   Charges:   PT Evaluation $PT Eval Moderate Complexity: 1 Mod PT Treatments $Therapeutic Activity: 8-22 mins PT General Charges $$ ACUTE PT VISIT: 1 Visit        Sylvan Delon Copp PT 05/12/2024  Acute Rehabilitation Services  Office [phone]

## 2024-05-12 NOTE — ED Provider Notes (Signed)
 Emergency Medicine Observation Re-evaluation Note  Cody Contreras is a 89 y.o. male, seen on rounds today.  Pt initially presented to the ED for complaints of Fall Currently, the patient is Resting.  Physical Exam  BP 136/81 (BP Location: Right Arm)   Pulse 63   Temp 98.2 F (36.8 C)   Resp 16   SpO2 100%  Physical Exam General: NAD Cardiac: RRR Lungs: normal effort   ED Course / MDM  EKG:EKG Interpretation Date/Time:  Friday May 11 2024 13:26:39 EDT Ventricular Rate:  48 PR Interval:  176 QRS Duration:  64 QT Interval:  446 QTC Calculation: 398 R Axis:   111  Text Interpretation: Sinus bradycardia Septal infarct , age undetermined Lateral infarct , age undetermined Abnormal ECG No previous ECGs available when com,pared to prior, similar appearance with bradycardia No STEMI Confirmed by Ginger Barefoot ([id number]) on 05/11/2024 3:10:58 PM  I have reviewed the labs performed to date as well as medications administered while in observation.  Recent changes in the last 24 hours include none .  Plan  Current plan is for placement.  No longer able to be taken care of at home due to frequent falls as well as advancing dementia.  Case management following.SABRA Simon Lavonia LOISE, MD 05/12/24 [phone]

## 2024-05-12 NOTE — Progress Notes (Signed)
 CSW spoke with pt's wife, Cody Contreras, who reported she has spoken with Sonny [place] and they will be calling her back at 5PM to discuss home care arrangements and paperwork.   Cody Contreras inquired about respite and CSW informed there are not any respite options at this time. CSW noted pt's insurance and informed pt would require 3 midnight inpatient stay in order to be placed into SNF from the hospital. CSW did inform pt is not meeting inpatient criteria. Cody Contreras verbalized understanding and informed she is awaiting her sister's arrival so that they can pick the pt up. Cody Contreras anticipates pick up time being between 2:30 and 3:00PM. CSW notified RN via secure chat.

## 2024-05-12 NOTE — ED Notes (Signed)
 NT and myself (Nurse) attempted to feed patient breakfast tray + applesauce. Patient would not chew/swallow food, however he was able to get some applesauce down with crushed medication. Requested a pureed diet for patient-provider ordered.

## 2024-05-12 NOTE — Progress Notes (Signed)
 CSW outreached to Garnette with [facility] to offer referral. Awaiting response.

## 2024-05-12 NOTE — ED Notes (Signed)
 Brief changed ?

## 2024-05-13 ENCOUNTER — Inpatient Hospital Stay (HOSPITAL_COMMUNITY)

## 2024-05-13 ENCOUNTER — Other Ambulatory Visit: Payer: Self-pay

## 2024-05-13 DIAGNOSIS — Z79899 Other long term (current) drug therapy: Secondary | ICD-10-CM | POA: Diagnosis not present

## 2024-05-13 DIAGNOSIS — Z6820 Body mass index (BMI) 20.0-20.9, adult: Secondary | ICD-10-CM | POA: Diagnosis not present

## 2024-05-13 DIAGNOSIS — W19XXXA Unspecified fall, initial encounter: Secondary | ICD-10-CM

## 2024-05-13 DIAGNOSIS — R1310 Dysphagia, unspecified: Secondary | ICD-10-CM | POA: Diagnosis present

## 2024-05-13 DIAGNOSIS — Z66 Do not resuscitate: Secondary | ICD-10-CM | POA: Diagnosis present

## 2024-05-13 DIAGNOSIS — Z820 Family history of epilepsy and other diseases of the nervous system: Secondary | ICD-10-CM | POA: Diagnosis not present

## 2024-05-13 DIAGNOSIS — E860 Dehydration: Secondary | ICD-10-CM | POA: Diagnosis present

## 2024-05-13 DIAGNOSIS — Z781 Physical restraint status: Secondary | ICD-10-CM | POA: Diagnosis not present

## 2024-05-13 DIAGNOSIS — N179 Acute kidney failure, unspecified: Secondary | ICD-10-CM | POA: Diagnosis present

## 2024-05-13 DIAGNOSIS — M549 Dorsalgia, unspecified: Secondary | ICD-10-CM | POA: Diagnosis present

## 2024-05-13 DIAGNOSIS — Z515 Encounter for palliative care: Secondary | ICD-10-CM | POA: Diagnosis not present

## 2024-05-13 DIAGNOSIS — I10 Essential (primary) hypertension: Secondary | ICD-10-CM | POA: Diagnosis present

## 2024-05-13 DIAGNOSIS — R410 Disorientation, unspecified: Secondary | ICD-10-CM

## 2024-05-13 DIAGNOSIS — Z7189 Other specified counseling: Secondary | ICD-10-CM | POA: Diagnosis not present

## 2024-05-13 DIAGNOSIS — K219 Gastro-esophageal reflux disease without esophagitis: Secondary | ICD-10-CM | POA: Diagnosis present

## 2024-05-13 DIAGNOSIS — R531 Weakness: Secondary | ICD-10-CM | POA: Diagnosis not present

## 2024-05-13 DIAGNOSIS — F0390 Unspecified dementia without behavioral disturbance: Secondary | ICD-10-CM | POA: Diagnosis present

## 2024-05-13 DIAGNOSIS — Z87891 Personal history of nicotine dependence: Secondary | ICD-10-CM | POA: Diagnosis not present

## 2024-05-13 DIAGNOSIS — Z833 Family history of diabetes mellitus: Secondary | ICD-10-CM | POA: Diagnosis not present

## 2024-05-13 DIAGNOSIS — N529 Male erectile dysfunction, unspecified: Secondary | ICD-10-CM | POA: Diagnosis present

## 2024-05-13 DIAGNOSIS — Z8673 Personal history of transient ischemic attack (TIA), and cerebral infarction without residual deficits: Secondary | ICD-10-CM | POA: Diagnosis not present

## 2024-05-13 DIAGNOSIS — E785 Hyperlipidemia, unspecified: Secondary | ICD-10-CM | POA: Diagnosis present

## 2024-05-13 DIAGNOSIS — B370 Candidal stomatitis: Secondary | ICD-10-CM | POA: Diagnosis present

## 2024-05-13 DIAGNOSIS — R627 Adult failure to thrive: Secondary | ICD-10-CM | POA: Diagnosis present

## 2024-05-13 DIAGNOSIS — G8929 Other chronic pain: Secondary | ICD-10-CM | POA: Diagnosis present

## 2024-05-13 LAB — URINALYSIS, W/ REFLEX TO CULTURE (INFECTION SUSPECTED)
Glucose, UA: NEGATIVE mg/dL
Hgb urine dipstick: NEGATIVE
Ketones, ur: 20 mg/dL — AB
Leukocytes,Ua: NEGATIVE
Nitrite: NEGATIVE
Protein, ur: 100 mg/dL — AB
Specific Gravity, Urine: 1.031 — ABNORMAL HIGH (ref 1.005–1.030)
pH: 5 (ref 5.0–8.0)

## 2024-05-13 LAB — CREATININE, SERUM
Creatinine, Ser: 1.25 mg/dL — ABNORMAL HIGH (ref 0.61–1.24)
GFR, Estimated: 56 mL/min — ABNORMAL LOW (ref >60)

## 2024-05-13 LAB — CBC
HCT: 42.4 % (ref 39.0–52.0)
Hemoglobin: 14.5 g/dL (ref 13.0–17.0)
MCH: 32.2 pg (ref 26.0–34.0)
MCHC: 34.2 g/dL (ref 30.0–36.0)
MCV: 94 fL (ref 80.0–100.0)
Platelets: 157 K/uL (ref 150–400)
RBC: 4.51 MIL/uL (ref 4.22–5.81)
RDW: 12.7 % (ref 11.5–15.5)
WBC: 6.8 K/uL (ref 4.0–10.5)
nRBC: 0 % (ref 0.0–0.2)

## 2024-05-13 LAB — CBC WITH DIFFERENTIAL/PLATELET
Abs Immature Granulocytes: 0.01 K/uL (ref 0.00–0.07)
Basophils Absolute: 0 K/uL (ref 0.0–0.1)
Basophils Relative: 0 %
Eosinophils Absolute: 0 K/uL (ref 0.0–0.5)
Eosinophils Relative: 0 %
HCT: 41.3 % (ref 39.0–52.0)
Hemoglobin: 14.1 g/dL (ref 13.0–17.0)
Immature Granulocytes: 0 %
Lymphocytes Relative: 18 %
Lymphs Abs: 1.1 K/uL (ref 0.7–4.0)
MCH: 31.8 pg (ref 26.0–34.0)
MCHC: 34.1 g/dL (ref 30.0–36.0)
MCV: 93.2 fL (ref 80.0–100.0)
Monocytes Absolute: 0.7 K/uL (ref 0.1–1.0)
Monocytes Relative: 12 %
Neutro Abs: 4.3 K/uL (ref 1.7–7.7)
Neutrophils Relative %: 70 %
Platelets: 155 K/uL (ref 150–400)
RBC: 4.43 MIL/uL (ref 4.22–5.81)
RDW: 12.7 % (ref 11.5–15.5)
WBC: 6.1 K/uL (ref 4.0–10.5)
nRBC: 0 % (ref 0.0–0.2)

## 2024-05-13 LAB — COMPREHENSIVE METABOLIC PANEL WITH GFR
ALT: 14 U/L (ref 0–44)
AST: 75 U/L — ABNORMAL HIGH (ref 15–41)
Albumin: 3.9 g/dL (ref 3.5–5.0)
Alkaline Phosphatase: 59 U/L (ref 38–126)
Anion gap: 15 (ref 5–15)
BUN: 36 mg/dL — ABNORMAL HIGH (ref 8–23)
CO2: 23 mmol/L (ref 22–32)
Calcium: 9.2 mg/dL (ref 8.9–10.3)
Chloride: 102 mmol/L (ref 98–111)
Creatinine, Ser: 1.3 mg/dL — ABNORMAL HIGH (ref 0.61–1.24)
GFR, Estimated: 53 mL/min — ABNORMAL LOW (ref >60)
Glucose, Bld: 100 mg/dL — ABNORMAL HIGH (ref 70–99)
Potassium: 4 mmol/L (ref 3.5–5.1)
Sodium: 140 mmol/L (ref 135–145)
Total Bilirubin: 1.4 mg/dL — ABNORMAL HIGH (ref 0.0–1.2)
Total Protein: 7.5 g/dL (ref 6.5–8.1)

## 2024-05-13 MED ORDER — SODIUM CHLORIDE 0.9 % IV SOLN: INTRAVENOUS | Status: DC

## 2024-05-13 MED ORDER — ACETAMINOPHEN 650 MG RE SUPP: 650.0000 mg | Freq: Four times a day (QID) | RECTAL | Status: DC | PRN

## 2024-05-13 MED ORDER — ENSURE PLUS HIGH PROTEIN PO LIQD
237.0000 mL | Freq: Two times a day (BID) | ORAL | Status: DC
  Administered 2024-05-14: 237 mL via ORAL

## 2024-05-13 MED ORDER — HYDRALAZINE HCL 20 MG/ML IJ SOLN: 5.0000 mg | Freq: Three times a day (TID) | INTRAMUSCULAR | Status: DC | PRN

## 2024-05-13 MED ORDER — ACETAMINOPHEN 325 MG PO TABS
650.0000 mg | ORAL_TABLET | Freq: Four times a day (QID) | ORAL | Status: DC | PRN
  Administered 2024-05-15: 650 mg via ORAL
  Filled 2024-05-13: qty 2

## 2024-05-13 MED ORDER — POTASSIUM CHLORIDE 10 MEQ/100ML IV SOLN
INTRAVENOUS | Status: AC
  Filled 2024-05-13: qty 100

## 2024-05-13 MED ORDER — SODIUM CHLORIDE 0.9 % IV BOLUS
1000.0000 mL | Freq: Once | INTRAVENOUS | Status: AC
  Administered 2024-05-13: 1000 mL via INTRAVENOUS

## 2024-05-13 MED ORDER — ENOXAPARIN SODIUM 40 MG/0.4ML IJ SOSY
40.0000 mg | PREFILLED_SYRINGE | Freq: Every day | INTRAMUSCULAR | Status: DC
  Administered 2024-05-13 – 2024-05-14 (×2): 40 mg via SUBCUTANEOUS
  Filled 2024-05-13 (×2): qty 0.4

## 2024-05-13 NOTE — ED Provider Notes (Signed)
 Nursing staff alerted me that they were concerned about the patient seeming a bit more somnolent, he has not been wanting to eat or drink since being back in our behavioral health area.  I reevaluated the patient, does respond, but immediately falls back asleep.  Overall looks dry.  Recheck labs on the patient, his creatinine is now increased to 1.3.  Was not able to encourage the patient to either eat or drink.  At this point, patient appears to be failure to thrive.  He is not appropriate at this time for placement from the ED.  Patient requires continued fluid resuscitation, likely long-term memory care placement if he does not improve, or even possibly hospice.  Discussed this with the patient's son Lyndy via phone.   Mannie Pac T, DO 05/13/24 1238

## 2024-05-13 NOTE — ED Provider Notes (Addendum)
 Emergency Medicine Observation Re-evaluation Note  Cody Contreras is a 89 y.o. male, seen on rounds today.  Pt initially presented to the ED for complaints of Fall Currently, the patient is in room without complaint.  Physical Exam  BP 116/67 (BP Location: Right Arm)   Pulse 62   Temp 98.2 F (36.8 C) (Axillary)   Resp 19   SpO2 95%  Physical Exam General: Awake, alert Cardiac: Normal rate Lungs: Normal effort Psych: Mood is appropriate  ED Course / MDM  EKG:EKG Interpretation Date/Time:  Friday May 11 2024 13:26:39 EDT Ventricular Rate:  48 PR Interval:  176 QRS Duration:  64 QT Interval:  446 QTC Calculation: 398 R Axis:   111  Text Interpretation: Sinus bradycardia Septal infarct , age undetermined Lateral infarct , age undetermined Abnormal ECG No previous ECGs available when com,pared to prior, similar appearance with bradycardia No STEMI Confirmed by Ginger Barefoot ([id number]) on 05/11/2024 3:10:58 PM  I have reviewed the labs performed to date as well as medications administered while in observation.  Recent changes in the last 24 hours include no changes.  Plan  Current plan is for placement, case management currently working on this.SABRA Mannie Fairy ONEIDA, DO 05/13/24 0826    Mannie Fairy T, DO 05/13/24 1236

## 2024-05-13 NOTE — ED Notes (Signed)
 Patient difficult to arouse, had to call patient's name loudly for him to open his eyes even slightly. Attempted to give patient crushed meds in pureed strawberries and patient began to gag. Urine output minimal and dark. Notified provider of patient's status. Provider advised-patient to be admitted.

## 2024-05-13 NOTE — Patient Care Conference (Signed)
 Met with pt's wife and other family members at bedside. RN also at bedside. Updated family. All questions answered. Code status addressed. Pt's wishes are confirmed to be DNR/DNI. Orders will be placed.

## 2024-05-13 NOTE — ED Notes (Signed)
Patient sleeping comfortably at this time. Sitter at bedside.  °

## 2024-05-13 NOTE — H&P (Signed)
 History and Physical    Patient: Cody Contreras DOB: 01/13/1934 DOA: 05/11/2024 DOS: the patient was seen and examined on 05/13/2024 PCP: [name], MD  Patient coming from: Home  Chief Complaint:  Chief Complaint  Patient presents with   Fall   HPI: Cody Contreras is a 89 y.o. male with medical history significant of HTN, HLD, GERD, dementia who initially presented to ED following a witnessed fall at home. In the ED, pt was clear from a trauma standpoint and was awaiting potential placement from ED. While waiting for placement, pt was noted to have poor PO intake. On 7/27, pt was noted to be more somnolent and appeared more dehydrated by this time. Lab work was notable for a Cr elevated to 1.30. There was also note of gaging while being fed, per staff. Given concerns of dehydration and ARF, hospitalist consulted for consideration for medical admission. Of note, no family was available at time of encounter  Review of Systems: unable to review all systems due to the inability of the patient to answer questions. Past Medical History:  Diagnosis Date   Chest pain    neg nuclear stress [phone] and 2002   Chronic back pain    Colon polyps    DDD (degenerative disc disease)    Diverticulosis    ED (erectile dysfunction)    GERD (gastroesophageal reflux disease)    Hyperlipidemia    Hypertension    History reviewed. No pertinent surgical history. Social History:  reports that he quit smoking about 25 years ago. His smoking use included cigarettes. He started smoking about 35 years ago. He has a 10 pack-year smoking history. He has never used smokeless tobacco. He reports that he does not drink alcohol and does not use drugs.  Allergies  Allergen Reactions   Donepezil  Other (See Comments)    Hallucinations    Tolterodine Nausea Only and Other (See Comments)    Hallucinations and sick    Family History  Problem Relation Age of Onset   Diabetes Mother     Dementia Mother    Dementia Sister    Alzheimer's disease Sister    Dementia Sister    Alzheimer's disease Sister     Prior to Admission medications   Medication Sig Start Date End Date Taking? Authorizing Provider  amLODipine  (NORVASC ) 10 MG tablet Take 1 tablet (10 mg total) by mouth daily. 07/05/23  Yes [name], MD  esomeprazole  (NEXIUM ) 40 MG capsule Take 1 capsule (40 mg total) by mouth daily before breakfast. 07/05/23  Yes [name], MD  hydrochlorothiazide  (HYDRODIURIL ) 25 MG tablet Take 1 tablet (25 mg total) by mouth daily. 07/05/23  Yes [name], MD  simvastatin  (ZOCOR ) 20 MG tablet Take 1 tablet (20 mg total) by mouth at bedtime. 07/05/23  Yes [name], MD  fluticasone  (FLONASE ) 50 MCG/ACT nasal spray Place 2 sprays into both nostrils daily. Patient not taking: Reported on 02/07/2024 07/05/23   [name], MD  loratadine  (CLARITIN ) 10 MG tablet Take 1 tablet (10 mg total) by mouth daily. Patient not taking: Reported on 02/07/2024 07/05/23   [name], MD    Physical Exam: Vitals:   05/12/24 0645 05/12/24 1335 05/12/24 2005 05/13/24 0618  BP: 136/81 (!) 131/95 118/71 116/67  Pulse: 63 78 79 62  Resp: 16 18 20 19   Temp: 98.2 F (36.8 C) 98.1 F (36.7 C) 97.7 F (36.5 C) 98.2 F (36.8 C)  TempSrc:  Oral Oral Axillary  SpO2: 100%  100% 97% 95%   General exam: Lethargic, laying in bed, in nad Respiratory system: Normal respiratory effort, no wheezing Cardiovascular system: regular rate, s1, s2 Gastrointestinal system: Soft, nondistended, positive BS Central nervous system: CN2-12 grossly intact, strength intact Extremities: Perfused, no clubbing Skin: Normal skin turgor, no notable skin lesions seen Psychiatry: Unable to assess given mentation  Data Reviewed:  Labs reviewed: Na 140, K 4.0, Cr 1.30, WBC 6.1, Hgb 14.1, Plts 155  Assessment and Plan: Hyperlipidemia Will hold statin for now given  lethargy and aspiration w/u below   Dementia without behavioral disturbance (HCC) -Pt seen with mitt restraints in place -will continue with PRN haldol  for now -When more awake and if able to tolerate PO, would continue pt with scheduled seroquel  -Baseline patient is previoulsy alert and oriented only to self and place -currently not on donezepil, citalopram  -given failure to thrive, and concerns of inadequate PO, will consult Palliative Care to address goals of care  -Will consult dietitian given poor intake   Essential hypertension -bp stable at this time --given lethargy, will hold off PO bp meds -Will cont on PRN hydralazine  for now  ARF -likely secondary to dehydration -Cont IVF  -recheck bmet in AM  Dehydration -continue IVF per aboe  Possible aspiration PNA -coarse breath sounds and active coughing noted on exam -Per staff, pt observed to gag while being fed -Will check CXR -Pt afebrile    Advance Care Planning:   Code Status: Full Code Full  Consults: Palliative Care  Family Communication: Pt in room, family not at bedside  Severity of Illness: The appropriate patient status for this patient is INPATIENT. Inpatient status is judged to be reasonable and necessary in order to provide the required intensity of service to ensure the patient's safety. The patient's presenting symptoms, physical exam findings, and initial radiographic and laboratory data in the context of their chronic comorbidities is felt to place them at high risk for further clinical deterioration. Furthermore, it is not anticipated that the patient will be medically stable for discharge from the hospital within 2 midnights of admission.   * I certify that at the point of admission it is my clinical judgment that the patient will require inpatient hospital care spanning beyond 2 midnights from the point of admission due to high intensity of service, high risk for further deterioration and high frequency  of surveillance required.*  Author: Garnette Pelt, MD 05/13/2024 2:43 PM  For on call review [email or web address].

## 2024-05-14 DIAGNOSIS — W19XXXA Unspecified fall, initial encounter: Secondary | ICD-10-CM | POA: Diagnosis not present

## 2024-05-14 DIAGNOSIS — Z7189 Other specified counseling: Secondary | ICD-10-CM

## 2024-05-14 DIAGNOSIS — R410 Disorientation, unspecified: Secondary | ICD-10-CM | POA: Diagnosis not present

## 2024-05-14 DIAGNOSIS — F0390 Unspecified dementia without behavioral disturbance: Secondary | ICD-10-CM | POA: Diagnosis not present

## 2024-05-14 DIAGNOSIS — Z515 Encounter for palliative care: Secondary | ICD-10-CM

## 2024-05-14 DIAGNOSIS — R531 Weakness: Secondary | ICD-10-CM

## 2024-05-14 LAB — COMPREHENSIVE METABOLIC PANEL WITH GFR
ALT: 12 U/L (ref 0–44)
AST: 71 U/L — ABNORMAL HIGH (ref 15–41)
Albumin: 3.6 g/dL (ref 3.5–5.0)
Alkaline Phosphatase: 53 U/L (ref 38–126)
Anion gap: 11 (ref 5–15)
BUN: 31 mg/dL — ABNORMAL HIGH (ref 8–23)
CO2: 25 mmol/L (ref 22–32)
Calcium: 9.2 mg/dL (ref 8.9–10.3)
Chloride: 105 mmol/L (ref 98–111)
Creatinine, Ser: 0.76 mg/dL (ref 0.61–1.24)
GFR, Estimated: >60 mL/min (ref >60)
Glucose, Bld: 107 mg/dL — ABNORMAL HIGH (ref 70–99)
Potassium: 3.9 mmol/L (ref 3.5–5.1)
Sodium: 141 mmol/L (ref 135–145)
Total Bilirubin: 1 mg/dL (ref 0.0–1.2)
Total Protein: 7.1 g/dL (ref 6.5–8.1)

## 2024-05-14 LAB — CBC
HCT: 40.1 % (ref 39.0–52.0)
Hemoglobin: 13.5 g/dL (ref 13.0–17.0)
MCH: 31.7 pg (ref 26.0–34.0)
MCHC: 33.7 g/dL (ref 30.0–36.0)
MCV: 94.1 fL (ref 80.0–100.0)
Platelets: 124 K/uL — ABNORMAL LOW (ref 150–400)
RBC: 4.26 MIL/uL (ref 4.22–5.81)
RDW: 12.6 % (ref 11.5–15.5)
WBC: 5.2 K/uL (ref 4.0–10.5)
nRBC: 0 % (ref 0.0–0.2)

## 2024-05-14 MED ORDER — HALOPERIDOL LACTATE 5 MG/ML IJ SOLN
5.0000 mg | Freq: Four times a day (QID) | INTRAMUSCULAR | Status: DC | PRN
  Administered 2024-05-14: 5 mg via INTRAVENOUS
  Filled 2024-05-14: qty 1

## 2024-05-14 MED ORDER — ENSURE PLUS HIGH PROTEIN PO LIQD
237.0000 mL | Freq: Three times a day (TID) | ORAL | Status: DC
  Administered 2024-05-14 – 2024-05-15 (×3): 237 mL via ORAL

## 2024-05-14 MED ORDER — THIAMINE MONONITRATE 100 MG PO TABS
100.0000 mg | ORAL_TABLET | Freq: Every day | ORAL | Status: DC
  Administered 2024-05-14 – 2024-05-15 (×2): 100 mg via ORAL
  Filled 2024-05-14 (×2): qty 1

## 2024-05-14 MED ORDER — QUETIAPINE FUMARATE 25 MG PO TABS: 25.0000 mg | ORAL_TABLET | Freq: Every day | ORAL | Status: DC

## 2024-05-14 MED ORDER — ADULT MULTIVITAMIN W/MINERALS CH
1.0000 | ORAL_TABLET | Freq: Every day | ORAL | Status: DC
  Administered 2024-05-14 – 2024-05-15 (×2): 1 via ORAL
  Filled 2024-05-14 (×2): qty 1

## 2024-05-14 NOTE — TOC Progression Note (Addendum)
 Transition of Care [facility]) - Progression Note    Patient Details  Name: Cody Contreras MRN: [id number] Date of Birth: Jun 07, 1934  Transition [facility]) CM/SW Contact  Doneta Glenys DASEN, RN Phone Number: 05/14/2024, 11:45 AM  Clinical Narrative:    CM spoke with patients spouse [name] Barnacle [phone] and received permission to start SNF work up.  12:17 PM CM started the PASRR. Waiting on PT note for Level 2 PASRR. Referral sent to SNF. 2:27 PM Choice offered Service Provider Request Status STAR(s) Address Phone Patient Preferred  [facility] Preferred SNF  Accepted 2 [address], [place] [address] [phone]   [facility] [place] Preferred SNF  Accepted 4 [address] [place] [address] [phone]   [facility] SNF  Accepted 1 [address], [place] [address] [phone]   [facility] Preferred SNF  Accepted 5 [address], [place] [address] [phone]   [facility] Preferred SNF  Accepted 1 [address] [place] [address] [phone]   3:05 PM Level II PASRR requested documents(H&P, FL2, 30-Day, PT, OT notes) uploaded to [place] [employer].  Expected Discharge Plan and Services    Social Drivers of Health (SDOH) Interventions SDOH Screenings   Food Insecurity: No Food Insecurity (05/13/2024)  Housing: Low Risk  (05/13/2024)  Transportation Needs: No Transportation Needs (05/13/2024)  Utilities: Not At Risk (05/13/2024)  Depression (PHQ2-9): Low Risk  (02/07/2024)  Social Connections: Moderately Integrated (05/13/2024)  Tobacco Use: Medium Risk (05/11/2024)    Readmission Risk Interventions     No data to display

## 2024-05-14 NOTE — Evaluation (Signed)
 Clinical/Bedside Swallow Evaluation Patient Details  Name: Cody Contreras MRN: [id number] Date of Birth: [birth year]  Today's Date: 05/14/2024 Time: SLP Start Time (ACUTE ONLY): 9063 SLP Stop Time (ACUTE ONLY): 0957 SLP Time Calculation (min) (ACUTE ONLY): 21 min  Past Medical History:  Past Medical History:  Diagnosis Date   Chest pain    neg nuclear stress [phone] and 2002   Chronic back pain    Colon polyps    DDD (degenerative disc disease)    Diverticulosis    ED (erectile dysfunction)    GERD (gastroesophageal reflux disease)    Hyperlipidemia    Hypertension    Past Surgical History: History reviewed. No pertinent surgical history. HPI:  Patient is an 89 y.o. male with PMH: dementia, GERD, HTN, HLD. He presented to the hospital on 05/13/24 for several days of worsening AMS, urinary frequency and a fall. Family reported that he lives at home with family. In ED, patient with poor PO intake, appearing dehydrated and more somnolent. He was started on a Dys 1 (puree) solids, thin liquids diet but SLP swallow evaluation ordered secondary to observed coughing and difficulty with PO intake.    Assessment / Plan / Recommendation  Clinical Impression  Patient presents with clinical s/s of what appears to be a cognitive based dysphagia. He has h/o GERD but no h/o oral or pharyngeal phase dysphagia found during chart review. Paient was awake, alert, oriented to self only. OT arrived during evaluation and worked with patient on managing holding of spoon and cup. Patient able to bring spoon to mouth and cup to lips with moderate to maximal hand over hand and tactile cues. With thin liquids, he exhibited both immediate and delayed cough response. With nectar thick liquids, no overt s/s aspiration observed. He tolerated puree solids (applesauce) with mild delay in oral transit. SLP recommending continue with puree solids but change liquids to nectar thick at this time. SLP will follow for  toleration of PO's and ability to advance. SLP Visit Diagnosis: Dysphagia, unspecified (R13.10)    Aspiration Risk  Mild aspiration risk;Moderate aspiration risk    Diet Recommendation Dysphagia 1 (Puree);Nectar-thick liquid    Liquid Administration via: Cup Medication Administration: Crushed with puree Supervision: Full supervision/cueing for compensatory strategies;Staff to assist with self feeding Compensations: Small sips/bites;Minimize environmental distractions;Slow rate Postural Changes: Seated upright at 90 degrees;Remain upright for at least 30 minutes after po intake    Other  Recommendations Oral Care Recommendations: Oral care BID     Assistance Recommended at Discharge    Functional Status Assessment Patient has had a recent decline in their functional status and demonstrates the ability to make significant improvements in function in a reasonable and predictable amount of time.  Frequency and Duration min 2x/week  1 week       Prognosis Prognosis for improved oropharyngeal function: Fair Barriers to Reach Goals: Cognitive deficits      Swallow Study   General Date of Onset: 05/13/24 HPI: Patient is an 89 y.o. male with PMH: dementia, GERD, HTN, HLD. He presented to the hospital on 05/13/24 for several days of worsening AMS, urinary frequency and a fall. Family reported that he lives at home with family. In ED, patient with poor PO intake, appearing dehydrated and more somnolent. He was started on a Dys 1 (puree) solids, thin liquids diet but SLP swallow evaluation ordered secondary to observed coughing and difficulty with PO intake. Type of Study: Bedside Swallow Evaluation Previous Swallow Assessment: none found Diet Prior  to this Study: Dysphagia 1 (pureed);Thin liquids (Level 0) Temperature Spikes Noted: No Respiratory Status: Room air History of Recent Intubation: No Oral Care Completed by SLP: Yes Oral Cavity - Dentition: Edentulous Self-Feeding Abilities:  Needs assist;Needs set up Patient Positioning: Upright in bed Baseline Vocal Quality: Low vocal intensity Volitional Cough: Cognitively unable to elicit Volitional Swallow: Unable to elicit    Oral/Motor/Sensory Function Overall Oral Motor/Sensory Function: Within functional limits   Ice Chips     Thin Liquid Thin Liquid: Impaired Presentation: Cup Pharyngeal  Phase Impairments: Suspected delayed Swallow;Cough - Delayed    Nectar Thick Nectar Thick Liquid: Impaired Presentation: Cup Oral Phase Impairments: Poor awareness of bolus   Honey Thick Honey Thick Liquid: Not tested   Puree Puree: Impaired Oral Phase Impairments: Poor awareness of bolus Oral Phase Functional Implications: Prolonged oral transit   Solid     Solid: Not tested     Norleen IVAR Blase, MA, CCC-SLP Speech Therapy

## 2024-05-14 NOTE — TOC PASRR Note (Signed)
 30 Day PASRR Note   Patient Details  Name: Cody Contreras Date of Birth: [birth year]   Transition of Care [facility]) CM/SW Contact:    Doneta Glenys DASEN, RN Phone Number: 05/14/2024, 2:02 PM  To Whom It May Concern:  Please be advised that this patient will require a short-term nursing home stay - anticipated 30 days or less for rehabilitation and strengthening.   The plan is for return home.

## 2024-05-14 NOTE — Plan of Care (Signed)

## 2024-05-14 NOTE — NC FL2 (Signed)
 [place]  MEDICAID [id number] LEVEL OF CARE FORM     IDENTIFICATION  Patient Name: Cody Contreras Birthdate: 09-21-1934 Sex: male Admission Date (Current Location): 05/11/2024  [facility] and [place] Number:  Producer, television/film/video and Address:  [facility],  [address], [place] [address]      Provider Number: [id number]  Attending Physician Name and Address:  Cindy Garnette [name], MD  Relative Name and Phone Number:  Tivis Quale (Spouse)  [phone]    Current Level of Care: Hospital Recommended Level of Care: Skilled Nursing Facility Prior Approval Number:    Date Approved/Denied:   PASRR Number:    Discharge Plan: SNF    Current Diagnoses: Patient Active Problem List   Diagnosis Date Noted   ARF (acute renal failure) (HCC) 05/13/2024   GERD (gastroesophageal reflux disease) 06/01/2023   CVA (cerebral vascular accident) (HCC) 06/01/2023   Syncope 09/03/2022   Dementia without behavioral disturbance (HCC) 09/03/2022   Hyperlipidemia 09/03/2022   Essential hypertension 11/15/2016    Orientation RESPIRATION BLADDER Height & Weight     Self  Normal Incontinent Weight: 67.4 kg Height:  5' 11 (180.3 cm)  BEHAVIORAL SYMPTOMS/MOOD NEUROLOGICAL BOWEL NUTRITION STATUS      Incontinent Diet  AMBULATORY STATUS COMMUNICATION OF NEEDS Skin   Extensive Assist Verbally Normal                       Personal Care Assistance Level of Assistance  Bathing, Feeding, Dressing, Total care Bathing Assistance: Maximum assistance Feeding assistance: Maximum assistance Dressing Assistance: Maximum assistance Total Care Assistance: Maximum assistance   Functional Limitations Info  Speech     Speech Info: Impaired    SPECIAL CARE FACTORS FREQUENCY  PT (By licensed PT), OT (By licensed OT)     PT Frequency: 5x weekly OT Frequency: 5x weekly            Contractures Contractures Info: Not present    Additional Factors Info  Code Status, Allergies  Code Status Info: Limited Allergies Info: Donepezil , Tolterodine           Current Medications (05/14/2024):  This is the current hospital active medication list Current Facility-Administered Medications  Medication Dose Route Frequency Provider Last Rate Last Admin   0.9 %  sodium chloride  infusion   Intravenous Continuous Cindy Garnette [name], MD 75 mL/hr at 05/14/24 0647 New Bag at 05/14/24 0647   acetaminophen  (TYLENOL ) tablet 650 mg  650 mg Oral Q6H PRN Cindy Garnette [name], MD       Or   acetaminophen  (TYLENOL ) suppository 650 mg  650 mg Rectal Q6H PRN Cindy Garnette [name], MD       enoxaparin  (LOVENOX ) injection 40 mg  40 mg Subcutaneous QHS Cindy Garnette [name], MD   40 mg at 05/13/24 2211   feeding supplement (ENSURE PLUS HIGH PROTEIN) liquid 237 mL  237 mL Oral TID BM Cindy Garnette [name], MD   237 mL at 05/14/24 1216   hydrALAZINE  (APRESOLINE ) injection 5 mg  5 mg Intravenous Q8H PRN Chiu, Stephen K, MD       multivitamin with minerals tablet 1 tablet  1 tablet Oral Daily Cindy Garnette [name], MD   1 tablet at 05/14/24 1219   pantoprazole  (PROTONIX ) EC tablet 40 mg  40 mg Oral Daily Tegeler, Lonni PARAS, MD   40 mg at 05/14/24 9065   thiamine  (VITAMIN B1) tablet 100 mg  100 mg Oral Daily Cindy Garnette [name], MD   100 mg  at 05/14/24 1218     Discharge Medications: Please see discharge summary for a list of discharge medications.  Relevant Imaging Results:  Relevant Lab Results:   Additional Information SSN [id number]  Doneta Glenys DASEN, RN

## 2024-05-14 NOTE — Progress Notes (Signed)
 Initial Nutrition Assessment  DOCUMENTATION CODES:   Not applicable  INTERVENTION:  -Continue dysphagia 1 diet, thin liquids per SLP -Add Ensure Plus High Protein BID -Add Magic Cup BID -Add MVI -Discussed importance of adequate PO intake with family/caregivers  NUTRITION DIAGNOSIS:   Inadequate oral intake related to lethargy/confusion, poor appetite as evidenced by meal completion < 25%.  GOAL:   Patient will meet greater than or equal to 90% of their needs  MONITOR:   PO intake, Weight trends, Supplement acceptance, Diet advancement, Labs, Skin  REASON FOR ASSESSMENT:   Consult Assessment of nutrition requirement/status  ASSESSMENT:   Hx HTN, HLD, GERD, dementia who initially presented to ED following a witnessed fall at home. Admit r/t ARF, dehyrdation, poor PO.  Spoke to pt's family in room, who also care for pt at home. No noted n/v/c/d, last BM PTA. Pt tolerating dysphagia 1 diet with feeding assistance. Per family, pt's appetite is improving, ate most of lunch with assistance. Add EPHP BID, MC BID to promote adequate intake. Discussed importance of adequate kcal/pro intake with family. Weight has been decreasing long term, particularly in the last two years. Unable to perform NFPE at today's visit, will complete at f/u. Pt appears to have muscle/fat losses. Pt's family deny additional question/concerns at this time, will continue to monitor, RDN available prn.   Labs BG 100-107 BUN 31 AST 71  Medications  enoxaparin  (LOVENOX ) injection  40 mg Subcutaneous QHS   feeding supplement  237 mL Oral TID BM   multivitamin with minerals  1 tablet Oral Daily   pantoprazole   40 mg Oral Daily   QUEtiapine   25 mg Oral QHS   thiamine   100 mg Oral Daily     NUTRITION - FOCUSED PHYSICAL EXAM:  Will complete at f/u  Diet Order:   Diet Order             DIET - DYS 1 Room service appropriate? Yes; Fluid consistency: Thin  Diet effective now                    EDUCATION NEEDS:   Education needs have been addressed  Skin:  Skin Assessment: Reviewed RN Assessment  Last BM:  PTA  Height:   Ht Readings from Last 1 Encounters:  05/13/24 5' 11 (1.803 m)    Weight:   Wt Readings from Last 1 Encounters:  05/13/24 67.4 kg    BMI:  Body mass index is 20.72 kg/m.  Estimated Nutritional Needs:   Kcal:  1700-2000  Protein:  70-80g  Fluid:  >/=1500 ml   Ace Bergfeld Daml-Budig, RDN, LDN Registered Dietitian Nutritionist RD Inpatient Contact Info in [place]

## 2024-05-14 NOTE — Hospital Course (Signed)
 89 y.o. male with medical history significant of HTN, HLD, GERD, dementia who initially presented to ED following a witnessed fall at home. In the ED, pt was clear from a trauma standpoint and was awaiting potential placement from ED. While waiting for placement, pt was noted to have poor PO intake. On 7/27, pt was noted to be more somnolent and appeared more dehydrated by this time. Lab work was notable for a Cr elevated to 1.30. There was also note of gaging while being fed, per staff. Given concerns of dehydration and ARF, hospitalist consulted for consideration for medical admission

## 2024-05-14 NOTE — Progress Notes (Addendum)
 Physical Therapy Treatment Patient Details Name: Cody Contreras MRN: [id number] DOB: October 02, 1934 Today's Date: 05/14/2024   History of Present Illness 89 y.o. male with a past medical history significant for previous throat, GERD, hypertension, hyperlipidemia, and dementia who presents with family for several days of worsening altered mental status, urinary frequency, and a fall.  According to family, the patient lives at home with family and over the last several days has had more confusion.    PT Comments  AxO x1 good eye contact and following repeat simple VC's with increased time.  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  Does respond to his name.  Good eye contact.  Able to looks at Spouse and mumble Cody Contreras.  At end of session, positioned upright to attempt self feeding.  Pt required repeat VC's to stay on task and increased time to respond.  Pt was able to briefly self grasp spoon and take a few bits with continued prompting.  Then drifted off/gazed when no instructions were given.  Assisted OOB to amb required increased time, repeat VC's and + 2 assist. General bed mobility comments: Pt showing initiation when prompted time to go repeatably with an out reached hand.  Required Max Assist to complete scooting to EOB.  Pt very stiff throughout and slow to move.  Once EOB, Pt was able to static sit at Supervision level but with poor forwartd flexed posture. General transfer comment: Pt showing initiation with sit to stand with out reached hand when given [facility] Cody, time to go.  Pt slow to rise and unsteady.  B flkexed hips and knees.  Unsteady.  Narrow BOS.  Unable to use a walker due to impaired cognition, Required + 2 hand held assist to rise as well as complete turning to recliner. General Gait Details: Pt was able to amb 32 feet + 2 side by side hand held assist.  Very slow short shuffled steps and poor forward flexed posture with B hip and knee flexion.  Unsteady with  poor self correction to midline as well as balance reaction responce.  Rigid.  Constant VC's to stay on task to encourage distance. HIGH FALL RISK. Prior to admit, Pt was living home with spouse and amb on his own.  Pt will need ST Rehab at SNF to address mobility and functional decline prior to safely returning home.    If plan is discharge home, recommend the following: Two people to help with walking and/or transfers;A lot of help with bathing/dressing/bathroom;Assistance with cooking/housework;Assist for transportation;Help with stairs or ramp for entrance;Direct supervision/assist for financial management;Direct supervision/assist for medications management   Can travel by private vehicle     No  Equipment Recommendations  Hospital bed;Wheelchair (measurements PT);Wheelchair cushion (measurements PT)    Recommendations for Other Services       Precautions / Restrictions Precautions Precautions: Fall Precaution/Restrictions Comments: Hx Falls and Dementia Restrictions Weight Bearing Restrictions Per Provider Order: No     Mobility  Bed Mobility Overal bed mobility: Needs Assistance Bed Mobility: Supine to Sit     Supine to sit: Max assist     General bed mobility comments: Pt showing initiation when prompted time to go repeatably with an out reached hand.  Required Max Assist to complete scooting to EOB.  Pt very stiff throughout and slow to move.  Once EOB, Pt was able to static sit at Supervision level but with poor forwartd flexed posture.    Transfers Overall transfer  level: Needs assistance Equipment used: 2 person hand held assist   Sit to Stand: Mod assist, +2 physical assistance           General transfer comment: Pt showing initiation with sit to stand with out reached hand when given [facility], time to go.  Pt slow to rise and unsteady.  B flkexed hips and knees.  Unsteady.  Narrow BOS.  Unable to use a walker due to impaired cognition, Required + 2 hand  held assist to rise as well as complete turning to recliner.    Ambulation/Gait Ambulation/Gait assistance: Mod assist, +2 physical assistance Gait Distance (Feet): 32 Feet Assistive device: None Gait Pattern/deviations: Step-through pattern, Decreased stride length, Shuffle, Festinating, Trunk flexed, Narrow base of support Gait velocity: decreased     General Gait Details: Pt was able to amb 32 feet + 2 side by side hand held assist.  Very slow short shuffled steps and poor forward flexed posture with B hip and knee flexion.  Unsteady with poor self correction to midline as well as balance reaction responce.  Rigid.  Constant VC's to stay on task to encourage distance. HIGH FALL RISK.   Stairs             Wheelchair Mobility     Tilt Bed    Modified Rankin (Stroke Patients Only)       Balance                                            Communication Communication Communication: Impaired Factors Affecting Communication: Difficulty expressing self;Reduced clarity of speech  Cognition Arousal: Alert Behavior During Therapy: Flat affect   PT - Cognitive impairments: History of cognitive impairments, Memory, Attention, Orientation, Problem solving, Safety/Judgement, Awareness                       PT - Cognition Comments: AxO x1 good eye contact and following repeat simple VC's with increased time.  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  Does respond to his name.  Good eye contact.  Able to looks at Spouse and mumble Cody Contreras.  At end of session, positioned upright to attempt self feeding.  Pt required repeat VC's to stay on task and increased time to respond.  Pt was able to briefly self grasp spoon and take a few bits with continued prompting.  Then drifted off/gazed when no instructions weere given. Following commands: Impaired Following commands impaired: Follows one step commands inconsistently    Cueing Cueing  Techniques: Verbal cues, Tactile cues, Gestural cues  Exercises      General Comments        Pertinent Vitals/Pain Pain Assessment Pain Assessment: No/denies pain    Home Living Family/patient expects to be discharged to:: Unsure (SNF vs comfort care.) Living Arrangements: Spouse/significant other Available Help at Discharge: Family;Available 24 hours/day Type of Home: House Home Access: Level entry       Home Layout: One level Home Equipment: Cane - single point      Prior Function            PT Goals (current goals can now be found in the care plan section) Progress towards PT goals: Progressing toward goals    Frequency    Min 2X/week      PT Plan  Co-evaluation   Reason for Co-Treatment: Complexity of the patient's impairments (multi-system involvement);To address functional/ADL transfers   OT goals addressed during session: ADL's and self-care;Strengthening/ROM SLP goals addressed during session: Swallowing;Communication    AM-PAC PT 6 Clicks Mobility   Outcome Measure  Help needed turning from your back to your side while in a flat bed without using bedrails?: A Lot Help needed moving from lying on your back to sitting on the side of a flat bed without using bedrails?: A Lot Help needed moving to and from a bed to a chair (including a wheelchair)?: A Lot Help needed standing up from a chair using your arms (e.g., wheelchair or bedside chair)?: A Lot Help needed to walk in hospital room?: A Lot Help needed climbing 3-5 steps with a railing? : Total 6 Click Score: 11    End of Session Equipment Utilized During Treatment: Gait belt Activity Tolerance: Patient limited by fatigue Patient left: in chair;with call bell/phone within reach;with chair alarm set;with family/visitor present Nurse Communication: Mobility status PT Visit Diagnosis: Unsteadiness on feet (R26.81);Difficulty in walking, not elsewhere classified (R26.2);History of falling  (Z91.81)     Time: 8695-8666 PT Time Calculation (min) (ACUTE ONLY): 29 min  Charges:    $Gait Training: 8-22 mins $Therapeutic Activity: 8-22 mins PT General Charges $$ ACUTE PT VISIT: 1 Visit                     Katheryn Leap  PTA Acute  Rehabilitation Services Office M-F          [phone]

## 2024-05-14 NOTE — Progress Notes (Signed)
  Progress Note   Patient: Cody Contreras FMW:[id number] DOB: Jan 11, 1934 DOA: 05/11/2024     1 DOS: the patient was seen and examined on 05/14/2024   Brief hospital course: 89 y.o. male with medical history significant of HTN, HLD, GERD, dementia who initially presented to ED following a witnessed fall at home. In the ED, pt was clear from a trauma standpoint and was awaiting potential placement from ED. While waiting for placement, pt was noted to have poor PO intake. On 7/27, pt was noted to be more somnolent and appeared more dehydrated by this time. Lab work was notable for a Cr elevated to 1.30. There was also note of gaging while being fed, per staff. Given concerns of dehydration and ARF, hospitalist consulted for consideration for medical admission   Assessment and Plan: Hyperlipidemia Home statin currently on hold   Dementia without behavioral disturbance (HCC) -will continue with PRN haldol  for now -Baseline patient is previoulsy alert and oriented only to self and place -currently not on donezepil, citalopram  -given failure to thrive, and concerns of inadequate PO, will consult Palliative Care to consulted to address GOC - Dietitian consulted given poor PO intake   Essential hypertension -bp stable at this time -Will cont on PRN hydralazine  for now   ARF -likely secondary to dehydration -Cont IVF  -recheck bmet in AM   Dehydration -improving with gentle IVF overnight, will continue given poor intake   Possible aspiration PNA -coarse breath sounds and active coughing noted on exam -Per staff, pt observed to gag while being fed -CXR neg for PNA -SLP following, for dysphagia 1 with nectar thick liquids         Subjective: Unable to assess. Pt awake, but nonverbal  Physical Exam: Vitals:   05/13/24 2005 05/14/24 0005 05/14/24 0405 05/14/24 1127  BP: (!) 141/78 (!) 140/78 (!) 141/78 133/80  Pulse: 68 69 (!) 56 (!) 57  Resp: 18 18 18 18   Temp: 98.6 F (37 C)  97.9 F (36.6 C) 99.1 F (37.3 C) 97.7 F (36.5 C)  TempSrc: Oral Axillary Oral   SpO2: 98% 98% 100% 98%  Weight:      Height:       General exam: Awake, laying in bed, in nad Respiratory system: Normal respiratory effort, no wheezing Cardiovascular system: regular rate, s1, s2 Gastrointestinal system: Soft, nondistended, positive BS Central nervous system: CN2-12 grossly intact, strength intact Extremities: Perfused, no clubbing Skin: Normal skin turgor, no notable skin lesions seen Psychiatry: Unable to assess given mentation  Data Reviewed:  Labs reviewed: na 141, K 3.9, Cr 0.76, WBC 5.2, hgb 13.5, Plts 124   Family Communication: Pt in room, family not at bedside  Disposition: Status is: Inpatient Remains inpatient appropriate because: severity of illness  Planned Discharge Destination: Skilled nursing facility    Author: Garnette Pelt, MD 05/14/2024 2:39 PM  For on call review [email or web address].

## 2024-05-14 NOTE — Evaluation (Signed)
 Occupational Therapy Evaluation Patient Details Name: Cody Contreras MRN: [id number] DOB: May 07, 1934 Today's Date: 05/14/2024   History of Present Illness   89 y.o. male with a past medical history significant for previous throat, GERD, hypertension, hyperlipidemia, and dementia who presents with family for several days of worsening altered mental status, urinary frequency, and a fall.  According to family, the patient lives at home with family and over the last several days has had more confusion.     Clinical Impressions Patient is currently requiring Total assistance with ALL basic ADLs at bed level. PT needs 2 people to safely mobilize to EOB so NT this session to focus on cognition and feeding with SLP.  Current level of function is significantly below patient's typical baseline.    During this evaluation, patient was limited by severe cognitive impairments with both expressive and receptive deficits, generalized weakness with stiffness to BUEs and increased bicep tone, impaired activity tolerance with pt obtunded during session with need of constant multimodal cues to stay awake and attend to feeding/grooming, and inability to grip a spoon or lift Ues sufficiently for self feeding, all of which has the potential to impact patient's and/or caregivers' safety and independence during functional mobility, as well as performance for ADLs.    Patient lives with his family who would be unable to safely provide the level of assistance that pt requires.  Patient demonstrates fair to guarded rehab potential, and should benefit from continued skilled occupational therapy services while in acute care to maximize safety, independence and quality of life at home.  If pt able to show some progress with PT and OT in acute care, then continued occupational therapy services after discharge from acute care from continued inpatient follow up therapy, <3 hours/day is recommended.  If pt unable to show progress  will sign off with therapy and recommend total comfort care.  ?      If plan is discharge home, recommend the following:   A lot of help with walking and/or transfers;A lot of help with bathing/dressing/bathroom;Two people to help with walking and/or transfers;Two people to help with bathing/dressing/bathroom;Assistance with feeding;Supervision due to cognitive status     Functional Status Assessment   Patient has had a recent decline in their functional status and demonstrates the ability to make significant improvements in function in a reasonable and predictable amount of time.     Equipment Recommendations    (TBD)     Recommendations for Other Services         Precautions/Restrictions   Precautions Precautions: Fall Recall of Precautions/Restrictions: Impaired Precaution/Restrictions Comments: Falls in past 6 months: 1 fall just prior to admission, 1 other fall a couple weeks ago. Restrictions Weight Bearing Restrictions Per Provider Order: No     Mobility Bed Mobility                    Transfers                          Balance                                           ADL either performed or assessed with clinical judgement   ADL Overall ADL's : Needs assistance/impaired Eating/Feeding: Total assistance;Cueing for sequencing;Bed level;Cueing for compensatory techinques Eating/Feeding Details (indicate cue type and reason): Pt unabler  to maintain effective grip in RT hand for spoon. Required hand over hand, multimodal cues and often Total Assist for bites of apple sauce and sips of thicked juice with SLP present. Grooming: Therapist, nutritional;Total assistance;Bed level;Cueing for sequencing;Cueing for compensatory techniques Grooming Details (indicate cue type and reason): Ablet o briefly wipe beaerd with washcloth placed in RT hand but very limited. Upper Body Bathing: Bed level;Total assistance   Lower Body Bathing:  Total assistance;Bed level;+2 for physical assistance   Upper Body Dressing : Bed level;Total assistance   Lower Body Dressing: Total assistance;Bed level;+2 for physical assistance     Toilet Transfer Details (indicate cue type and reason): Unable to safely assess Toileting- Clothing Manipulation and Hygiene: Total assistance;+2 for physical assistance;Bed level         General ADL Comments: Focused on bed level eating and grooming promoting use of BUEs and increasing attention/wakefulness to task.     Vision   Additional Comments: Does not open eyes for long. Keeps eyes mostly closed even when opening. No visual attention to tasks despite cues.     Perception         Praxis         Pertinent Vitals/Pain Pain Assessment Pain Assessment: PAINAD Breathing: normal Negative Vocalization: occasional moan/groan, low speech, negative/disapproving quality Facial Expression: smiling or inexpressive Body Language: relaxed Consolability: no need to console PAINAD Score: 1 Pain Intervention(s): Monitored during session, Limited activity within patient's tolerance     Extremity/Trunk Assessment Upper Extremity Assessment Upper Extremity Assessment: Difficult to assess due to impaired cognition;Generalized weakness (UEs stiff with PROM. Inconsistent UE movements with observation of lifting RT elbow to ~70-80 degrees but not to command and not for self feeding. Did lift with washcloth to wipe beard briefly.)   Lower Extremity Assessment Lower Extremity Assessment: Difficult to assess due to impaired cognition;Generalized weakness       Communication Communication Communication: Impaired Factors Affecting Communication: Difficulty expressing self;Reduced clarity of speech   Cognition Arousal: Obtunded Behavior During Therapy: Restless Cognition: No family/caregiver present to determine baseline, Cognition impaired   Orientation impairments: Place, Time, Situation (Cannot  state name but will open eyes to [place].) Awareness: Intellectual awareness impaired, Online awareness impaired Memory impairment (select all impairments): Short-term memory, Working Civil Service fast streamer, Non-declarative long-term memory, Geneticist, molecular long-term memory Attention impairment (select first level of impairment): Focused attention Executive functioning impairment (select all impairments): Initiation, Organization, Sequencing, Reasoning, Problem solving                   Following commands: Impaired Following commands impaired: Follows one step commands inconsistently     Cueing  General Comments   Cueing Techniques: Verbal cues;Tactile cues;Gestural cues      Exercises     Shoulder Instructions      Home Living Family/patient expects to be discharged to:: Unsure (SNF vs comfort care.) Living Arrangements: Spouse/significant other Available Help at Discharge: Family;Available 24 hours/day Type of Home: House Home Access: Level entry     Home Layout: One level               Home Equipment: Cane - single point          Prior Functioning/Environment               Mobility Comments: walks with SPC; 2 falls in past 6 months ADLs Comments: wife assists with bathing/dressing    OT Problem List: Decreased strength;Decreased knowledge of use of DME or AE;Impaired tone;Decreased knowledge of precautions;Decreased coordination;Decreased range  of motion;Decreased activity tolerance;Decreased cognition;Impaired UE functional use;Decreased safety awareness;Impaired balance (sitting and/or standing)   OT Treatment/Interventions: Self-care/ADL training;Therapeutic exercise;Balance training;Neuromuscular education;Therapeutic activities;Cognitive remediation/compensation;DME and/or AE instruction;Visual/perceptual remediation/compensation;Patient/family education      OT Goals(Current goals can be found in the care plan section)   Acute Rehab OT Goals OT Goal  Formulation: Patient unable to participate in goal setting Time For Goal Achievement: 05/28/24 Potential to Achieve Goals: Fair (guarded) ADL Goals Pt Will Perform Eating: with mod assist;with adaptive utensils;sitting;with assist to don/doff brace/orthosis Pt Will Perform Grooming: with mod assist;sitting Pt Will Transfer to Toilet: with mod assist;squat pivot transfer;stand pivot transfer;bedside commode Pt/caregiver will Perform Home Exercise Program: Increased ROM;Increased strength;Both right and left upper extremity;With minimal assist (Increased flexibility.) Additional ADL Goal #1: Pt will demonstrate improved mentation, by identifying at least 50% of common objects used for ADLs, in order to increase his participation in his own self care.   OT Frequency:  Min 1X/week    Co-evaluation PT/OT/SLP Co-Evaluation/Treatment: Yes Reason for Co-Treatment: Complexity of the patient's impairments (multi-system involvement);To address functional/ADL transfers   OT goals addressed during session: ADL's and self-care;Strengthening/ROM SLP goals addressed during session: Swallowing;Communication    AM-PAC OT 6 Clicks Daily Activity     Outcome Measure Help from another person eating meals?: Total Help from another person taking care of personal grooming?: Total Help from another person toileting, which includes using toliet, bedpan, or urinal?: Total Help from another person bathing (including washing, rinsing, drying)?: Total Help from another person to put on and taking off regular upper body clothing?: Total Help from another person to put on and taking off regular lower body clothing?: Total 6 Click Score: 6   End of Session Nurse Communication: Other (comment) (Okay to see per RN)  Activity Tolerance: Patient limited by lethargy Patient left: in bed;with bed alarm set;with restraints reapplied  OT Visit Diagnosis: Other symptoms and signs involving cognitive function;Cognitive  communication deficit (R41.841);Feeding difficulties (R63.3);Muscle weakness (generalized) (M62.81);Adult, failure to thrive (R62.7)                Time: 0936-1000 OT Time Calculation (min): 24 min Charges:  OT General Charges $OT Visit: 1 Visit OT Evaluation $OT Eval Moderate Complexity: 1 Mod OT Treatments $Self Care/Home Management : 8-22 mins  Delon, OT Acute Rehab Services Office: [phone] 05/14/2024    Delon Falter 05/14/2024, 10:11 AM

## 2024-05-14 NOTE — Consult Note (Signed)
 Consultation Note Date: 05/14/2024   Patient Name: Cody Contreras  DOB: [birth year]  MRN: [id number]  Age / Sex: 89 y.o., male  PCP: [name], MD Referring Physician: Cindy Garnette [name], MD  Reason for Consultation: Establishing goals of care  HPI/Patient Profile: 89 y.o. male admitted on 05/11/2024  .   Clinical Assessment and Goals of Care: 89 year old gentleman history of hypertension dyslipidemia GERD dementia presented from home found to have witnessed fall, found to have poor oral intake, found to be more somnolent and dehydrated while under observation in the emergency department, hospitalist was consulted for consideration for medical admission because of creatinine elevated at 1.3, patient noted to be gagging while being fed and for concerns for dehydration and acute renal failure. Patient remains admitted to hospital medicine service. Palliative consult for ongoing goals of care discussions has been requested. Chart reviewed Patient seen and examined I find the patient to be an elderly appearing gentleman wearing mittens, opens his eyes when his name is called out loudly, attempts to follow commands with OT colleague in the room.  Also discussed with TOC.  Call placed but unable to reach spouse at this time. Palliative medicine is specialized medical care for people living with serious illness. It focuses on providing relief from the symptoms and stress of a serious illness. The goal is to improve quality of life for both the patient and the family. Goals of care: Broad aims of medical therapy in relation to the patient's values and preferences. Our aim is to provide medical care aimed at enabling patients to achieve the goals that matter most to them, given the circumstances of their particular medical situation and their constraints.    NEXT OF KIN  Spouse [name] Ali. [phone].    SUMMARY OF RECOMMENDATIONS   Agree with DNR Continue current mode of care Monitor for hospital course and current pain-pain symptom management regimen, monitor p.o. intake, monitor PT OT participation.  Discussed with TOC colleague.  Skilled nursing facility rehabilitation attempt is being recommended.  Discussed with OT colleague in the room.  Recommend outpatient palliative at next facility.    Call placed to discuss this with spouse [name] Barnacle at [phone] today at the time of initial consultation however unable to reach.    old MOST form from 2023 outlining full code full scope care however patient family members have had conversations with [facility] MD with regards to patient's preference for DNR on 05-13-2024.  Agree with DNR and once able to discuss with spouse, will void the old MOST form palliative to reattempt on 05-15-2024. Thank you for the consult  Code Status/Advance Care Planning: DNR   Symptom Management:     Palliative Prophylaxis:  Bowel Regimen   Psycho-social/Spiritual:  Desire for further Chaplaincy support:yes Additional Recommendations: Caregiving  Support/Resources  Prognosis:  Unable to determine  Discharge Planning: Skilled Nursing Facility for rehab with Palliative care service follow-up      Primary Diagnoses: Present on Admission:  ARF (acute renal failure) (HCC)  I have reviewed the medical record, interviewed the patient and family, and examined the patient. The following aspects are pertinent.  Past Medical History:  Diagnosis Date   Chest pain    neg nuclear stress [phone] and 2002   Chronic back pain    Colon polyps    DDD (degenerative disc disease)    Diverticulosis    ED (erectile dysfunction)    GERD (gastroesophageal reflux disease)    Hyperlipidemia    Hypertension    Social History   Socioeconomic History   Marital status: Divorced    Spouse name: Not on file   Number of children: Not on file   Years of  education: Not on file   Highest education level: Not on file  Occupational History   Not on file  Tobacco Use   Smoking status: Former    Current packs/day: 0.00    Average packs/day: 1 pack/day for 10.0 years (10.0 ttl pk-yrs)    Types: Cigarettes    Start date: 27    Quit date: 2000    Years since quitting: 25.5   Smokeless tobacco: Never  Vaping Use   Vaping status: Not on file  Substance and Sexual Activity   Alcohol use: No   Drug use: No   Sexual activity: Not on file  Other Topics Concern   Not on file  Social History Narrative   Per [facility] New Patient Packet abstracted on 06/01/2023      Diet: left blank      Caffeine: yes      Married, if yes what year: yes, 2018      Do you live in a house, apartment, assisted living, condo, trailer, ect: Townhouse      Is it one or more stories: 2      How many persons live in your home?  2      Pets: No      Highest level or education completed: 12th grade      Current/Past profession: N/A      Exercise:           No       Type and how often: N/A         Living Will: No   DNR: No, would like to discuss    POA/HPOA: No      Functional Status:   Do you have difficulty bathing or dressing yourself? Yes   Do you have difficulty preparing food or eating? Yes   Do you have difficulty managing your medications? Yes   Do you have difficulty managing your finances? Yes   Do you have difficulty affording your medications? Yes   Social Drivers of Corporate investment banker Strain: Not on file  Food Insecurity: No Food Insecurity (05/13/2024)   Hunger Vital Sign    Worried About Running Out of Food in the Last Year: Never true    Ran Out of Food in the Last Year: Never true  Transportation Needs: No Transportation Needs (05/13/2024)   PRAPARE - Administrator, Civil Service (Medical): No    Lack of Transportation (Non-Medical): No  Physical Activity: Not on file  Stress: Not on file  Social Connections:  Moderately Integrated (05/13/2024)   Social Connection and Isolation Panel    Frequency of Communication with Friends and Family: Three times a week    Frequency of Social Gatherings with Friends and Family: Three times a week    Attends Religious Services: More  than 4 times per year    Active Member of Clubs or Organizations: No    Attends Banker Meetings: Never    Marital Status: Married   Family History  Problem Relation Age of Onset   Diabetes Mother    Dementia Mother    Dementia Sister    Alzheimer's disease Sister    Dementia Sister    Alzheimer's disease Sister    Scheduled Meds:  enoxaparin  (LOVENOX ) injection  40 mg Subcutaneous QHS   feeding supplement  237 mL Oral TID BM   multivitamin with minerals  1 tablet Oral Daily   pantoprazole   40 mg Oral Daily   thiamine   100 mg Oral Daily   Continuous Infusions:  sodium chloride  75 mL/hr at 05/14/24 0647   PRN Meds:.acetaminophen  **OR** acetaminophen , hydrALAZINE  Medications Prior to Admission:  Prior to Admission medications   Medication Sig Start Date End Date Taking? Authorizing Provider  amLODipine  (NORVASC ) 10 MG tablet Take 1 tablet (10 mg total) by mouth daily. 07/05/23  Yes [name], MD  esomeprazole  (NEXIUM ) 40 MG capsule Take 1 capsule (40 mg total) by mouth daily before breakfast. 07/05/23  Yes [name], MD  hydrochlorothiazide  (HYDRODIURIL ) 25 MG tablet Take 1 tablet (25 mg total) by mouth daily. 07/05/23  Yes [name], MD  simvastatin  (ZOCOR ) 20 MG tablet Take 1 tablet (20 mg total) by mouth at bedtime. 07/05/23  Yes [name], MD  fluticasone  (FLONASE ) 50 MCG/ACT nasal spray Place 2 sprays into both nostrils daily. Patient not taking: Reported on 02/07/2024 07/05/23   [name], MD  loratadine  (CLARITIN ) 10 MG tablet Take 1 tablet (10 mg total) by mouth daily. Patient not taking: Reported on 02/07/2024 07/05/23   [name], MD    Allergies  Allergen Reactions   Donepezil  Other (See Comments)    Hallucinations    Tolterodine Nausea Only and Other (See Comments)    Hallucinations and sick   Review of Systems +weakness  Physical Exam General Sitting up in bed  Wearing mittens Appears with generalized weakness Attempts to follow some commands Regular work of breathing Vital Signs: BP 133/80 (BP Location: Right Arm)   Pulse (!) 57   Temp 97.7 F (36.5 C)   Resp 18   Ht 5' 11 (1.803 m)   Wt 67.4 kg   SpO2 98%   BMI 20.72 kg/m  Pain Scale: 0-10   Pain Score: 0-No pain   SpO2: SpO2: 98 % O2 Device:SpO2: 98 % O2 Flow Rate: .   IO: Intake/output summary:  Intake/Output Summary (Last 24 hours) at 05/14/2024 1341 Last data filed at 05/14/2024 0650 Gross per 24 hour  Intake --  Output 400 ml  Net -400 ml    LBM:   Baseline Weight: Weight: 67.4 kg Most recent weight: Weight: 67.4 kg     Palliative Assessment/Data:   PPS 40%  Time In:  12 Time Out:  1300 Time Total:  60 Greater than 50%  of this time was spent counseling and coordinating care related to the above assessment and plan.  Signed by: Lonia Serve, MD   Please contact Palliative Medicine Team phone at [phone] for questions and concerns.  For individual provider: See Tracey

## 2024-05-15 DIAGNOSIS — R410 Disorientation, unspecified: Secondary | ICD-10-CM | POA: Diagnosis not present

## 2024-05-15 DIAGNOSIS — B370 Candidal stomatitis: Secondary | ICD-10-CM | POA: Diagnosis not present

## 2024-05-15 DIAGNOSIS — Z515 Encounter for palliative care: Secondary | ICD-10-CM | POA: Diagnosis not present

## 2024-05-15 DIAGNOSIS — W19XXXA Unspecified fall, initial encounter: Secondary | ICD-10-CM | POA: Diagnosis not present

## 2024-05-15 DIAGNOSIS — F0390 Unspecified dementia without behavioral disturbance: Secondary | ICD-10-CM | POA: Diagnosis not present

## 2024-05-15 DIAGNOSIS — Z7189 Other specified counseling: Secondary | ICD-10-CM | POA: Diagnosis not present

## 2024-05-15 DIAGNOSIS — R531 Weakness: Secondary | ICD-10-CM | POA: Diagnosis not present

## 2024-05-15 LAB — COMPREHENSIVE METABOLIC PANEL WITH GFR
ALT: 16 U/L (ref 0–44)
AST: 58 U/L — ABNORMAL HIGH (ref 15–41)
Albumin: 3.2 g/dL — ABNORMAL LOW (ref 3.5–5.0)
Alkaline Phosphatase: 50 U/L (ref 38–126)
Anion gap: 5 (ref 5–15)
BUN: 19 mg/dL (ref 8–23)
CO2: 26 mmol/L (ref 22–32)
Calcium: 8.7 mg/dL — ABNORMAL LOW (ref 8.9–10.3)
Chloride: 110 mmol/L (ref 98–111)
Creatinine, Ser: 0.7 mg/dL (ref 0.61–1.24)
GFR, Estimated: >60 mL/min (ref >60)
Glucose, Bld: 124 mg/dL — ABNORMAL HIGH (ref 70–99)
Potassium: 3.9 mmol/L (ref 3.5–5.1)
Sodium: 141 mmol/L (ref 135–145)
Total Bilirubin: 0.7 mg/dL (ref 0.0–1.2)
Total Protein: 6.7 g/dL (ref 6.5–8.1)

## 2024-05-15 MED ORDER — QUETIAPINE FUMARATE 25 MG PO TABS: 25.0000 mg | ORAL_TABLET | Freq: Every day | ORAL | Status: AC

## 2024-05-15 MED ORDER — NYSTATIN 100000 UNIT/ML MT SUSP
5.0000 mL | Freq: Four times a day (QID) | OROMUCOSAL | Status: DC
  Administered 2024-05-15 (×2): 500000 [IU] via ORAL
  Filled 2024-05-15 (×2): qty 5

## 2024-05-15 NOTE — Plan of Care (Signed)

## 2024-05-15 NOTE — TOC Progression Note (Signed)
 Transition of Care [facility]) - Progression Note    Patient Details  Name: Cody Contreras MRN: [id number] Date of Birth: 12-Dec-1933  [facility] CM/SW Contact  Doneta [facility] DASEN, RN Phone Number: 05/15/2024, 9:08 AM  Clinical Narrative:    CM received chat from Jon PEAK that spouse has chosen [facility] for SNF. CM contacted [facility]. [facility] will come evaluate patient this am. CM called patients spouse [name] [phone] to inform her that no additional SNF has accepted her husband and [facility] will be coming this am to evaluate patient. Expected Discharge Plan and Services   Social Drivers of Health (SDOH) Interventions SDOH Screenings   Food Insecurity: No Food Insecurity (05/13/2024)  Housing: Low Risk  (05/13/2024)  Transportation Needs: No Transportation Needs (05/13/2024)  Utilities: Not At Risk (05/13/2024)  Depression (PHQ2-9): Low Risk  (02/07/2024)  Social Connections: Moderately Integrated (05/13/2024)  Tobacco Use: Medium Risk (05/11/2024)    Readmission Risk Interventions     No data to display

## 2024-05-15 NOTE — Progress Notes (Signed)
 Daily Progress Note   Patient Name: Cody Contreras       Date: 05/15/2024 DOB: 07-18-1934  Age: 89 y.o. MRN#: [id number] Attending Physician: Cindy Garnette [name], MD Primary Care Physician: [name], MD Admit Date: 05/11/2024  Reason for Consultation/Follow-up: Establishing goals of care  Subjective: Awake alert, attempts to verbalize, mittens are off, tracks me in the room, is able to state his name and the name of his spouse Has oral thrush  Length of Stay: 2  Current Medications: Scheduled Meds:   enoxaparin  (LOVENOX ) injection  40 mg Subcutaneous QHS   feeding supplement  237 mL Oral TID BM   multivitamin with minerals  1 tablet Oral Daily   pantoprazole   40 mg Oral Daily   QUEtiapine   25 mg Oral QHS   thiamine   100 mg Oral Daily    Continuous Infusions:  sodium chloride  75 mL/hr at 05/14/24 1935    PRN Meds: acetaminophen  **OR** acetaminophen , haloperidol  lactate, hydrALAZINE   Physical Exam         Has oral thrush Awake alert Thin extremities Abdomen not distended No edema  Vital Signs: BP (!) 152/85 (BP Location: Right Arm)   Pulse (!) 55   Temp (!) 97.5 F (36.4 C) (Oral)   Resp 16   Ht 5' 11 (1.803 m)   Wt 67.4 kg   SpO2 97%   BMI 20.72 kg/m  SpO2: SpO2: 97 % O2 Device: O2 Device: Room Air O2 Flow Rate:    Intake/output summary:  Intake/Output Summary (Last 24 hours) at 05/15/2024 0857 Last data filed at 05/15/2024 0532 Gross per 24 hour  Intake 2268.04 ml  Output 1100 ml  Net 1168.04 ml   LBM:   Baseline Weight: Weight: 67.4 kg Most recent weight: Weight: 67.4 kg       Palliative Assessment/Data:      Patient Active Problem List   Diagnosis Date Noted   ARF (acute renal failure) (HCC) 05/13/2024   GERD (gastroesophageal reflux  disease) 06/01/2023   CVA (cerebral vascular accident) (HCC) 06/01/2023   Syncope 09/03/2022   Dementia without behavioral disturbance (HCC) 09/03/2022   Hyperlipidemia 09/03/2022   Essential hypertension 11/15/2016    Palliative Care Assessment & Plan   Patient Profile:    Assessment:  89 year old gentleman history of hypertension dyslipidemia GERD dementia presented from  home found to have witnessed fall, found to have poor oral intake, found to be more somnolent and dehydrated while under observation in the emergency department, hospitalist was consulted for consideration for medical admission because of creatinine elevated at 1.3, patient noted to be gagging while being fed and for concerns for dehydration and acute renal failure. Patient remains admitted to hospital medicine service. Palliative consult for ongoing goals of care discussions has been requested.  Recommendations/Plan:  DNR DNI Call placed and goals of care discussed with spouse [name] - DNR DNI re confirmed. Plan is for SNF rehab with palliative. Will void previous MOST form which was full code full scope care, since this is no longer reflective of the patient's wishes at this time.  Encourage PO and PT Add Nystatin  PO suspension.    Goals of Care and Additional Recommendations: Limitations on Scope of Treatment: Avoid Hospitalization  Code Status:    Code Status Orders  (From admission, onward)           Start     Ordered   05/13/24 1748  Do not attempt resuscitation (DNR)- Limited -Do Not Intubate (DNI)  (Code Status)  Continuous       Question Answer Comment  If pulseless and not breathing No CPR or chest compressions.   In Pre-Arrest Conditions (Patient Is Breathing and Has A Pulse) Do not intubate. Provide all appropriate non-invasive medical interventions. Avoid ICU transfer unless indicated or required.   Consent: Discussion documented in EHR or advanced directives reviewed      05/13/24 1747            Code Status History     Date Active Date Inactive Code Status Order ID Comments User Context   05/13/2024 1414 05/13/2024 1747 Full Code [id number]  Cindy Garnette [name], MD ED   09/03/2022 2014 09/04/2022 2059 Full Code [id number]  Ricky Alfrieda DASEN, DO ED       Prognosis:  < 12 months  Discharge Planning: Skilled Nursing Facility for rehab with Palliative care service follow-up  Care plan was discussed with patient, spouse on phone.   Thank you for allowing the Palliative Medicine Team to assist in the care of this patient. Mod MDM.      Greater than 50%  of this time was spent counseling and coordinating care related to the above assessment and plan.  Lonia Serve, MD  Please contact Palliative Medicine Team phone at [phone] for questions and concerns.

## 2024-05-15 NOTE — Discharge Summary (Signed)
 Physician Discharge Summary   Patient: Cody Contreras MRN: [id number] DOB: Apr 11, 1934  Admit date:     05/11/2024  Discharge date: 05/15/24  Discharge Physician: Garnette Pelt   PCP: [name], MD   Recommendations at discharge:    Follow up with PCP in 1-2 weeks Recommend Palliative Care referral at facility Code Status DNR/DNI  Discharge Diagnoses: Principal Problem:   ARF (acute renal failure) (HCC)  Resolved Problems:   * No resolved hospital problems. *  Hospital Course: 89 y.o. male with medical history significant of HTN, HLD, GERD, dementia who initially presented to ED following a witnessed fall at home. In the ED, pt was clear from a trauma standpoint and was awaiting potential placement from ED. While waiting for placement, pt was noted to have poor PO intake. On 7/27, pt was noted to be more somnolent and appeared more dehydrated by this time. Lab work was notable for a Cr elevated to 1.30. There was also note of gaging while being fed, per staff. Given concerns of dehydration and ARF, hospitalist consulted for consideration for medical admission   Assessment and Plan: Hyperlipidemia On statin PTA   Dementia without behavioral disturbance (HCC) -will continue with PRN haldol  for now -Baseline patient is previoulsy alert and oriented only to self and place -currently not on donezepil, citalopram  -given failure to thrive, and concerns of inadequate PO, consulted Palliative Care to consulted to address GOC - Dietitian consulted given poor PO intake -recommend palliative care referral at facility   Essential hypertension -bp stable at this time -cont home med with exception of diuretic   ARF -likely secondary to dehydration -resolved with IVF   Dehydration -resolved with IVF   Aspiration PNA ruled out -coarse breath sounds and active coughing noted on exam -Per staff, pt observed to gag while being fed -CXR neg for PNA -SLP following, for  dysphagia 1 with nectar thick liquids       Consultants:  Procedures performed:   Disposition: Skilled nursing facility Diet recommendation:  Dysphagia type 1 nectar thick Liquid DISCHARGE MEDICATION: Allergies as of 05/15/2024       Reactions   Donepezil  Other (See Comments)   Hallucinations    Tolterodine Nausea Only, Other (See Comments)   Hallucinations and sick        Medication List     STOP taking these medications    fluticasone  50 MCG/ACT nasal spray Commonly known as: FLONASE    hydrochlorothiazide  25 MG tablet Commonly known as: HYDRODIURIL    loratadine  10 MG tablet Commonly known as: CLARITIN        TAKE these medications    amLODipine  10 MG tablet Commonly known as: NORVASC  Take 1 tablet (10 mg total) by mouth daily.   esomeprazole  40 MG capsule Commonly known as: NEXIUM  Take 1 capsule (40 mg total) by mouth daily before breakfast.   QUEtiapine  25 MG tablet Commonly known as: SEROQUEL  Take 1 tablet (25 mg total) by mouth at bedtime.   simvastatin  20 MG tablet Commonly known as: ZOCOR  Take 1 tablet (20 mg total) by mouth at bedtime.        Discharge Exam: Filed Weights   05/13/24 1604  Weight: 67.4 kg   General exam: Awake, laying in bed, in nad Respiratory system: Normal respiratory effort, no wheezing Cardiovascular system: regular rate, s1, s2 Gastrointestinal system: Soft, nondistended, positive BS Central nervous system: CN2-12 grossly intact, strength intact Extremities: Perfused, no clubbing Skin: Normal skin turgor, no notable skin lesions seen Psychiatry: Mood normal //  no visual hallucinations   Condition at discharge: fair  The results of significant diagnostics from this hospitalization (including imaging, microbiology, ancillary and laboratory) are listed below for reference.   Imaging Studies: DG CHEST PORT 1 VIEW Result Date: 05/13/2024 CLINICAL DATA:  Aspiration EXAM: PORTABLE CHEST 1 VIEW COMPARISON:  Chest  x-ray 05/12/2024 FINDINGS: The heart size and mediastinal contours are within normal limits. Both lungs are clear. The visualized skeletal structures are unremarkable. IMPRESSION: No active disease. Electronically Signed   By: Greig Pique M.D.   On: 05/13/2024 15:11   DG Chest Port 1 View Result Date: 05/12/2024 CLINICAL DATA:  Concern for aspiration EXAM: PORTABLE CHEST 1 VIEW COMPARISON:  Film from the previous day. FINDINGS: Cardiac shadow is within normal limits. The lungs are well aerated bilaterally. Skin folds are noted over the right chest. No focal infiltrate or effusion is noted. No bony abnormality is seen. IMPRESSION: No acute abnormality noted. Electronically Signed   By: Oneil Devonshire M.D.   On: 05/12/2024 20:28   DG Lumbar Spine Complete Result Date: 05/11/2024 CLINICAL DATA:  Fall. EXAM: LUMBAR SPINE - COMPLETE 4+ VIEW COMPARISON:  06/14/2016. FINDINGS: 5 nonrib bearing lumbar-type vertebral bodies. Diffuse osseous demineralization. Similar dextrocurvature of the thoracolumbar spine. Vertebral body heights are maintained. No acute fracture. No static listhesis. Mild-to-moderate multilevel degenerative disc changes of the mid to lower lumbar spine. IMPRESSION: 1. No acute fracture or traumatic listhesis of the lumbar spine. 2. Mild-to-moderate multilevel degenerative disc changes of the mid to lower lumbar spine. Electronically Signed   By: Harrietta Sherry M.D.   On: 05/11/2024 15:08   DG Pelvis 1-2 Views Result Date: 05/11/2024 CLINICAL DATA:  Fall. EXAM: PELVIS - 1-2 VIEW COMPARISON:  07/08/2014. FINDINGS: No acute fracture or diastasis. Femoral heads are seated within the acetabula. Serpiginous sclerotic change of the right femoral head is again noted, compatible with AVN. Mild-to-moderate degenerative changes of the bilateral hips. Sacroiliac joints and pubic symphysis are anatomically aligned. Degenerative changes of the visualized lower lumbar spine. IMPRESSION: 1. No acute osseous  abnormality. 2. Chronic AVN of the right femoral head. Electronically Signed   By: Harrietta Sherry M.D.   On: 05/11/2024 15:02   DG Chest 2 View Result Date: 05/11/2024 CLINICAL DATA:  Fall, altered mental status EXAM: CHEST - 2 VIEW COMPARISON:  Chest x-ray October 26, 2018 FINDINGS: The heart size and mediastinal contours are within normal limits. No airspace opacity/consolidation. The visualized skeletal structures are unremarkable. IMPRESSION: No active cardiopulmonary disease. Electronically Signed   By: Megan  Zare M.D.   On: 05/11/2024 15:01   CT Head Wo Contrast Result Date: 05/11/2024 CLINICAL DATA:  Mental status change, unknown cause Altered mental status, confusion, fall, dementia EXAM: CT HEAD WITHOUT CONTRAST TECHNIQUE: Contiguous axial images were obtained from the base of the skull through the vertex without intravenous contrast. RADIATION DOSE REDUCTION: This exam was performed according to the departmental dose-optimization program which includes automated exposure control, adjustment of the mA and/or kV according to patient size and/or use of iterative reconstruction technique. COMPARISON:  September 03, 2022 FINDINGS: Brain: Proportional prominence of the ventricles and sulci, consistent with diffuse cerebral parenchymal volume loss. The ventricles otherwise maintained midline position without midline shift. Gray-white differentiation is preserved.Confluent periventricular and subcortical white matter hypoattenuation, most consistent with changes of severe chronic ischemic microvascular disease.No evidence of acute territorial infarction, extra-axial fluid collection, hemorrhage, or mass lesion. The basilar cisterns are patent without downward herniation. The cerebellar hemispheres and vermis are well  formed without mass lesion or focal attenuation abnormality. Vascular: No hyperdense vessel. Calcified atherosclerotic plaque within the cavernous/supraclinoid ICA and intradural vertebral  arteries. Skull: Normal. Negative for fracture or focal lesion. Sinuses/Orbits: The paranasal sinuses and mastoids are clear.The globes appear intact. No retrobulbar hematoma. Other: None. IMPRESSION: 1. No acute intracranial abnormality, specifically, no acute hemorrhage, territorial infarction, or intracranial mass. 2. Global cerebral volume loss with sequelae of advanced chronic ischemic microvascular disease. Electronically Signed   By: Rogelia Myers M.D.   On: 05/11/2024 14:36    Microbiology: Results for orders placed or performed during the hospital encounter of 09/03/22  Resp Panel by RT-PCR (Flu A&B, Covid) Anterior Nasal Swab     Status: None   Collection Time: 09/03/22  3:34 PM   Specimen: Anterior Nasal Swab  Result Value Ref Range Status   SARS Coronavirus 2 by RT PCR NEGATIVE NEGATIVE Final    Comment: (NOTE) SARS-CoV-2 target nucleic acids are NOT DETECTED.  The SARS-CoV-2 RNA is generally detectable in upper respiratory specimens during the acute phase of infection. The lowest concentration of SARS-CoV-2 viral copies this assay can detect is 138 copies/mL. A negative result does not preclude SARS-Cov-2 infection and should not be used as the sole basis for treatment or other patient management decisions. A negative result may occur with  improper specimen collection/handling, submission of specimen other than nasopharyngeal swab, presence of viral mutation(s) within the areas targeted by this assay, and inadequate number of viral copies(<138 copies/mL). A negative result must be combined with clinical observations, patient history, and epidemiological information. The expected result is Negative.  Fact Sheet for Patients:  BloggerCourse.com  Fact Sheet for Healthcare Providers:  SeriousBroker.it  This test is no t yet approved or cleared by the [place]  FDA and  has been authorized for detection and/or diagnosis  of SARS-CoV-2 by FDA under an Emergency Use Authorization (EUA). This EUA will remain  in effect (meaning this test can be used) for the duration of the COVID-19 declaration under Section 564(b)(1) of the Act, 21 U.S.C.section 360bbb-3(b)(1), unless the authorization is terminated  or revoked sooner.       Influenza A by PCR NEGATIVE NEGATIVE Final   Influenza B by PCR NEGATIVE NEGATIVE Final    Comment: (NOTE) The Xpert Xpress SARS-CoV-2/FLU/RSV plus assay is intended as an aid in the diagnosis of influenza from Nasopharyngeal swab specimens and should not be used as a sole basis for treatment. Nasal washings and aspirates are unacceptable for Xpert Xpress SARS-CoV-2/FLU/RSV testing.  Fact Sheet for Patients: BloggerCourse.com  Fact Sheet for Healthcare Providers: SeriousBroker.it  This test is not yet approved or cleared by the [place]  FDA and has been authorized for detection and/or diagnosis of SARS-CoV-2 by FDA under an Emergency Use Authorization (EUA). This EUA will remain in effect (meaning this test can be used) for the duration of the COVID-19 declaration under Section 564(b)(1) of the Act, 21 U.S.C. section 360bbb-3(b)(1), unless the authorization is terminated or revoked.  Performed at [facility], [address], [place], [place] [address]   MRSA Next Gen by PCR, Nasal     Status: None   Collection Time: 09/04/22 12:51 AM   Specimen: Nasal Mucosa; Nasal Swab  Result Value Ref Range Status   MRSA by PCR Next Gen NOT DETECTED NOT DETECTED Final    Comment: (NOTE) The GeneXpert MRSA Assay (FDA approved for NASAL specimens only), is one component of a comprehensive MRSA colonization surveillance program. It is  not intended to diagnose MRSA infection nor to guide or monitor treatment for MRSA infections. Test performance is not FDA approved in patients less than 23 years old. Performed at [facility], [address] [place], [place] [address]     Labs: CBC: Recent Labs  Lab 05/11/24 1224 05/13/24 1137 05/13/24 1519 05/14/24 0420  WBC 3.3* 6.1 6.8 5.2  NEUTROABS 2.6 4.3  --   --   HGB 12.2* 14.1 14.5 13.5  HCT 36.2* 41.3 42.4 40.1  MCV 92.6 93.2 94.0 94.1  PLT 137* 155 157 124*   Basic Metabolic Panel: Recent Labs  Lab 05/11/24 1224 05/13/24 1137 05/13/24 1519 05/14/24 0420 05/15/24 1039  NA 141 140  --  141 141  K 3.9 4.0  --  3.9 3.9  CL 104 102  --  105 110  CO2 26 23  --  25 26  GLUCOSE 100* 100*  --  107* 124*  BUN 11 36*  --  31* 19  CREATININE 0.83 1.30* 1.25* 0.76 0.70  CALCIUM 9.6 9.2  --  9.2 8.7*   Liver Function Tests: Recent Labs  Lab 05/11/24 1224 05/13/24 1137 05/14/24 0420 05/15/24 1039  AST 24 75* 71* 58*  ALT 11 14 12 16   ALKPHOS 63 59 53 50  BILITOT 0.6 1.4* 1.0 0.7  PROT 7.2 7.5 7.1 6.7  ALBUMIN 4.0 3.9 3.6 3.2*   CBG: No results for input(s): GLUCAP in the last 168 hours.  Discharge time spent: less than 30 minutes.  Signed: Garnette Pelt, MD [facility] 05/15/2024

## 2024-05-15 NOTE — TOC Transition Note (Signed)
 Transition of Care [facility]) - Discharge Note   Patient Details  Name: Cody Contreras MRN: [id number] Date of Birth: [birth year]  [facility]) CM/SW Contact:  Doneta Glenys DASEN, RN Phone Number: 05/15/2024, 2:35 PM   Clinical Narrative:    Per MD patient ready for DC to [facility]. RN to call report prior to discharge [phone] Rm [phone]). RN, patient, patient's family, and facility notified of DC. Discharge Summary and FL2 sent to facility via the HUB. DC packet on chart includes medical necessity, face sheet,discharge summery and signed DNR. Ambulance transport [facility] requested for patient at 2:30 PM.  CM will sign off for now. Please consult us  again if new needs arise.  Final next level of care: Skilled Nursing Facility [facility]) Barriers to Discharge: Barriers Resolved   Patient Goals and CMS Choice   CMS Medicare.gov Compare Post Acute Care list provided to:: Patient Represenative (must comment) (Jacquline) Choice offered to / list presented to : Spouse [place] ownership interest in [facility].provided to:: Spouse    Discharge Placement   Existing PASRR number confirmed : 05/15/24          Patient chooses bed at: [facility] Patient to be transferred to facility by: [facility] Name of family member notified: Jacquline Patient and family notified of of transfer: 05/15/24  Discharge Plan and Services Additional resources added to the After Visit Summary for                  DME Arranged: N/A DME Agency: NA       HH Arranged: NA HH Agency: NA        Social Drivers of Health (SDOH) Interventions SDOH Screenings   Food Insecurity: No Food Insecurity (05/13/2024)  Housing: Low Risk  (05/13/2024)  Transportation Needs: No Transportation Needs (05/13/2024)  Utilities: Not At Risk (05/13/2024)  Depression (PHQ2-9): Low Risk  (02/07/2024)  Social Connections: Moderately Integrated (05/13/2024)  Tobacco Use: Medium Risk (05/11/2024)      Readmission Risk Interventions     No data to display

## 2024-05-15 NOTE — Telephone Encounter (Signed)
 [name], MD to [facility]  Suellen Devin BROCKS, CMA (Selected Message)     05/15/24  8:28 AM Pt is hospitalized and looks like they are planning to discharge him to SNF Probably we can void respite care FL2 since he will be discharging to SNF and pt currently being in the hospital  Noted

## 2024-05-29 ENCOUNTER — Emergency Department (HOSPITAL_COMMUNITY)

## 2024-05-29 ENCOUNTER — Encounter (HOSPITAL_COMMUNITY): Payer: Self-pay

## 2024-05-29 ENCOUNTER — Other Ambulatory Visit: Payer: Self-pay

## 2024-05-29 ENCOUNTER — Emergency Department (HOSPITAL_COMMUNITY)
Admission: EM | Admit: 2024-05-29 | Discharge: 2024-05-29 | Disposition: A | Discharge status: Home / Self Care | Attending: Emergency Medicine | Admitting: Emergency Medicine

## 2024-05-29 DIAGNOSIS — D649 Anemia, unspecified: Secondary | ICD-10-CM | POA: Diagnosis not present

## 2024-05-29 DIAGNOSIS — I10 Essential (primary) hypertension: Secondary | ICD-10-CM | POA: Diagnosis not present

## 2024-05-29 DIAGNOSIS — F0390 Unspecified dementia without behavioral disturbance: Secondary | ICD-10-CM | POA: Insufficient documentation

## 2024-05-29 DIAGNOSIS — E876 Hypokalemia: Secondary | ICD-10-CM | POA: Diagnosis present

## 2024-05-29 DIAGNOSIS — W06XXXA Fall from bed, initial encounter: Secondary | ICD-10-CM | POA: Insufficient documentation

## 2024-05-29 DIAGNOSIS — Z79899 Other long term (current) drug therapy: Secondary | ICD-10-CM | POA: Insufficient documentation

## 2024-05-29 DIAGNOSIS — W19XXXA Unspecified fall, initial encounter: Secondary | ICD-10-CM

## 2024-05-29 LAB — BASIC METABOLIC PANEL WITH GFR
Anion gap: 11 (ref 5–15)
BUN: 7 mg/dL — ABNORMAL LOW (ref 8–23)
CO2: 29 mmol/L (ref 22–32)
Calcium: 8.8 mg/dL — ABNORMAL LOW (ref 8.9–10.3)
Chloride: 98 mmol/L (ref 98–111)
Creatinine, Ser: 0.62 mg/dL (ref 0.61–1.24)
GFR, Estimated: >60 mL/min (ref >60)
Glucose, Bld: 126 mg/dL — ABNORMAL HIGH (ref 70–99)
Potassium: 3.3 mmol/L — ABNORMAL LOW (ref 3.5–5.1)
Sodium: 138 mmol/L (ref 135–145)

## 2024-05-29 LAB — CBC WITH DIFFERENTIAL/PLATELET
Abs Immature Granulocytes: 0.01 K/uL (ref 0.00–0.07)
Basophils Absolute: 0 K/uL (ref 0.0–0.1)
Basophils Relative: 0 %
Eosinophils Absolute: 0 K/uL (ref 0.0–0.5)
Eosinophils Relative: 1 %
HCT: 31.3 % — ABNORMAL LOW (ref 39.0–52.0)
Hemoglobin: 11.2 g/dL — ABNORMAL LOW (ref 13.0–17.0)
Immature Granulocytes: 0 %
Lymphocytes Relative: 17 %
Lymphs Abs: 0.5 K/uL — ABNORMAL LOW (ref 0.7–4.0)
MCH: 32.6 pg (ref 26.0–34.0)
MCHC: 35.8 g/dL (ref 30.0–36.0)
MCV: 91 fL (ref 80.0–100.0)
Monocytes Absolute: 0.5 K/uL (ref 0.1–1.0)
Monocytes Relative: 15 %
Neutro Abs: 2 K/uL (ref 1.7–7.7)
Neutrophils Relative %: 67 %
Platelets: 222 K/uL (ref 150–400)
RBC: 3.44 MIL/uL — ABNORMAL LOW (ref 4.22–5.81)
RDW: 12.1 % (ref 11.5–15.5)
WBC: 3.1 K/uL — ABNORMAL LOW (ref 4.0–10.5)
nRBC: 0 % (ref 0.0–0.2)

## 2024-05-29 LAB — CK: Total CK: 722 U/L — ABNORMAL HIGH (ref 49–397)

## 2024-05-29 MED ORDER — POTASSIUM CHLORIDE CRYS ER 20 MEQ PO TBCR
20.0000 meq | EXTENDED_RELEASE_TABLET | Freq: Once | ORAL | Status: AC
  Administered 2024-05-29 (×2): 20 meq via ORAL
  Filled 2024-05-29: qty 1

## 2024-05-29 MED ORDER — MAGNESIUM OXIDE -MG SUPPLEMENT 400 (240 MG) MG PO TABS
400.0000 mg | ORAL_TABLET | Freq: Once | ORAL | Status: AC
  Administered 2024-05-29 (×2): 400 mg via ORAL
  Filled 2024-05-29: qty 1

## 2024-05-29 NOTE — Discharge Instructions (Addendum)
 Trauma workup was overall reassuring. Continue working with PT at your facility, and follow-up with your PCP

## 2024-05-29 NOTE — ED Notes (Signed)
 [facility] called unable to give a pick up time

## 2024-05-29 NOTE — ED Provider Notes (Signed)
 [place] EMERGENCY DEPARTMENT AT [facility] Provider Note   CSN: [id number] Arrival date & time: 05/29/24  1035     Patient presents with: Cody Contreras   Cody Contreras is a 89 y.o. male.    Fall     89 year old male with medical history significant for HTN, HLD, dementia presenting from [facility] after staff noticed he had had an unwitnessed fall out of bed.  Unknown for how long he was on the ground for.  Unknown loss of consciousness.  The patient is unable to provide additional history of present illness.  On arrival, the patient was GCS 14, ABC intact, denied any pain or injuries or complaints.  Prior to Admission medications   Medication Sig Start Date End Date Taking? Authorizing Provider  amLODipine  (NORVASC ) 10 MG tablet Take 1 tablet (10 mg total) by mouth daily. 07/05/23   [name], MD  esomeprazole  (NEXIUM ) 40 MG capsule Take 1 capsule (40 mg total) by mouth daily before breakfast. 07/05/23   [name], MD  QUEtiapine  (SEROQUEL ) 25 MG tablet Take 1 tablet (25 mg total) by mouth at bedtime. 05/15/24 06/14/24  Cindy Garnette [name], MD  simvastatin  (ZOCOR ) 20 MG tablet Take 1 tablet (20 mg total) by mouth at bedtime. 07/05/23   [name], MD    Allergies: Donepezil  and Tolterodine    Review of Systems  Unable to perform ROS: Dementia    Updated Vital Signs BP (!) 140/78   Pulse 80   Temp 98.8 F (37.1 C) (Oral)   Resp 19   SpO2 100%   Physical Exam Vitals and nursing note reviewed.  Constitutional:      Appearance: He is well-developed.     Comments: GCS 14, ABC intact, AAOx2  HENT:     Head: Normocephalic.  Eyes:     Conjunctiva/sclera: Conjunctivae normal.  Neck:     Comments: No midline tenderness to palpation of the cervical spine. ROM intact. Cardiovascular:     Rate and Rhythm: Normal rate and regular rhythm.     Heart sounds: No murmur heard. Pulmonary:     Effort: Pulmonary effort is  normal. No respiratory distress.     Breath sounds: Normal breath sounds.  Chest:     Comments: Chest wall stable and non-tender to AP and lateral compression. Clavicles stable and non-tender to AP compression Abdominal:     Palpations: Abdomen is soft.     Tenderness: There is no abdominal tenderness.     Comments: Pelvis stable to lateral compression.  Musculoskeletal:     Cervical back: Neck supple.     Comments: No midline tenderness to palpation of the thoracic or lumbar spine. Extremities atraumatic with intact ROM.   Skin:    General: Skin is warm and dry.  Neurological:     Mental Status: He is alert.     Comments: CN II-XII grossly intact. Moving all four extremities spontaneously and sensation grossly intact.     (all labs ordered are listed, but only abnormal results are displayed) Labs Reviewed  CBC WITH DIFFERENTIAL/PLATELET - Abnormal; Notable for the following components:      Result Value   WBC 3.1 (*)    RBC 3.44 (*)    Hemoglobin 11.2 (*)    HCT 31.3 (*)    Lymphs Abs 0.5 (*)    All other components within normal limits  BASIC METABOLIC PANEL WITH GFR - Abnormal; Notable for the following components:   Potassium  3.3 (*)    Glucose, Bld 126 (*)    BUN 7 (*)    Calcium 8.8 (*)    All other components within normal limits  CK - Abnormal; Notable for the following components:   Total CK 722 (*)    All other components within normal limits    EKG: EKG Interpretation Date/Time:  Tuesday May 29 2024 10:43:22 EDT Ventricular Rate:  73 PR Interval:  174 QRS Duration:  66 QT Interval:  415 QTC Calculation: 458 R Axis:   54  Text Interpretation: Sinus rhythm Low voltage, precordial leads Anteroseptal infarct, old Confirmed by Jerrol Agent ([id number]) on 05/29/2024 10:55:55 AM  Radiology: CT Cervical Spine Wo Contrast Result Date: 05/29/2024 CLINICAL DATA:  89 year old male status post unwitnessed fall from bed. EXAM: CT CERVICAL SPINE WITHOUT CONTRAST  TECHNIQUE: Multidetector CT imaging of the cervical spine was performed without intravenous contrast. Multiplanar CT image reconstructions were also generated. RADIATION DOSE REDUCTION: This exam was performed according to the departmental dose-optimization program which includes automated exposure control, adjustment of the mA and/or kV according to patient size and/or use of iterative reconstruction technique. COMPARISON:  Head CT today.  CT cervical spine 06/14/2016. FINDINGS: Alignment: Straightening of cervical lordosis not significantly changed. Mild chronic degenerative anterolisthesis of C7 on T1. Maintained posterior element alignment. Skull base and vertebrae: Normal background bone mineralization for age. Visualized skull base is intact. No atlanto-occipital dissociation. C1 and C2 appear intact and aligned. No acute osseous abnormality identified. Soft tissues and spinal canal: No prevertebral fluid or swelling. No visible canal hematoma. Chronic calcified cervical carotid atherosclerosis. Disc levels: New since 2017 degenerative appearing ankylosis of the cervical levels C4 through C7. Pronounced chronic degenerative appearing ligament flavum and posterior longitudinal ligament calcification. Bulky chronic disc and/or ligamentous degeneration at C3-C4. Chronic but progressed multifactorial spinal stenosis there, now moderate to severe (series 10, image 38 and sagittal image 44. chronic facet arthropathy at C7-T1. Upper chest: Visible upper thoracic levels appear intact. Mild apical lung scarring is stable. IMPRESSION: 1. No acute traumatic injury identified in the cervical spine. 2. 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 Electronically Signed   By: VEAR Hurst M.D.   On: 05/29/2024 11:35   CT Head Wo Contrast Result Date: 05/29/2024 CLINICAL DATA:  89 year old male status post  unwitnessed fall from bed. EXAM: CT HEAD WITHOUT CONTRAST TECHNIQUE: Contiguous axial images were obtained from the base of the skull through the vertex without intravenous contrast. RADIATION DOSE REDUCTION: This exam was performed according to the departmental dose-optimization program which includes automated exposure control, adjustment of the mA and/or kV according to patient size and/or use of iterative reconstruction technique. COMPARISON:  Head CT 05/11/2024. FINDINGS: Brain: Stable cerebral volume. Confluent, widespread cerebral white matter hypodensity. Chronic lacunar infarcts in the deep gray nuclei, more pronounced on the right. No midline shift, ventriculomegaly, mass effect, evidence of mass lesion, intracranial hemorrhage or evidence of cortically based acute infarction. Vascular: Calcified atherosclerosis at the skull base. No suspicious intracranial vascular hyperdensity. Skull: Stable and intact.  No acute osseous abnormality identified. Sinuses/Orbits: Visualized paranasal sinuses and mastoids are clear. Other: Visualized orbits and scalp soft tissues are within normal limits. IMPRESSION: 1. No acute intracranial abnormality or acute traumatic injury identified. 2. Stable non contrast CT appearance of advanced chronic small vessel disease. Electronically Signed   By: VEAR Hurst M.D.   On: 05/29/2024 11:30  DG Pelvis Portable Result Date: 05/29/2024 CLINICAL DATA:  fall, dementia. EXAM: PORTABLE PELVIS 1-2 VIEWS COMPARISON:  05/11/2024. FINDINGS: Pelvis is intact with normal and symmetric sacroiliac joints. No acute fracture or dislocation. No aggressive osseous lesion. Redemonstration of heterogeneous sclerosis involving the right femoral head and to a lesser extent ? Left femoral head, concerning for underlying bilateral AVN, right more than left. Visualized sacral arcuate lines are unremarkable. Unremarkable symphysis pubis. There are mild degenerative changes of bilateral hip joints without  significant joint space narrowing. Osteophytosis of the superior acetabulum. No radiopaque foreign bodies. IMPRESSION: *No acute fracture or dislocation. Findings concerning for bilateral femoral head AVN, right more than left. Electronically Signed   By: Ree Molt M.D.   On: 05/29/2024 11:28   DG Chest Portable 1 View Result Date: 05/29/2024 CLINICAL DATA:  fall, dementia. EXAM: PORTABLE CHEST 1 VIEW COMPARISON:  05/13/2024. FINDINGS: Low lung volume. Bilateral lung fields are clear. Bilateral costophrenic angles are clear. Normal cardio-mediastinal silhouette. No acute osseous abnormalities. The soft tissues are within normal limits. IMPRESSION: No active disease. Electronically Signed   By: Ree Molt M.D.   On: 05/29/2024 11:23     Procedures   Medications Ordered in the ED  potassium chloride  SA (KLOR-CON  M) CR tablet 20 mEq (20 mEq Oral Given 05/29/24 1246)  magnesium  oxide (MAG-OX) tablet 400 mg (400 mg Oral Given 05/29/24 1246)                                    Medical Decision Making Amount and/or Complexity of Data Reviewed Labs: ordered. Radiology: ordered.  Risk OTC drugs. Prescription drug management.    89 year old male with medical history significant for HTN, HLD, dementia presenting from [facility] after staff noticed he had had an unwitnessed fall out of bed.  Unknown for how long he was on the ground for.  Unknown loss of consciousness.  The patient is unable to provide additional history of present illness.  On arrival, the patient was GCS 14, ABC intact, denied any pain or injuries or complaints.  On arrival, patient was vitally stable, history limited by the patient's dementia, presenting after an unwitnessed fall.  Physical exam generally unremarkable, revealed a pleasantly demented patient in no distress.  Some difficulty with range of motion of the bilateral hips.  Will obtain screening x-rays in addition to CT imaging of the head  and cervical spine and labs and the patient was down on the ground for an unknown time.  CXR: Neg  Pelvis XR:  IMPRESSION:  *No acute fracture or dislocation. Findings concerning for bilateral  femoral head AVN, right more than left.   Of note, the patient had x-ray imaging of the hip consistent with avascular necrosis back in 2015.  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   CT Head: IMPRESSION:  1. No acute intracranial abnormality or acute traumatic injury  identified.  2. Stable non contrast CT appearance of advanced chronic small  vessel disease.    CT Cervical Spine: IMPRESSION:  1. No acute traumatic injury identified in the cervical spine.  2. 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      Labs: CK 722, CBC without a leukocytosis, mild anemia to 11.2 at the patient's baseline, BMP with mild hypokalemia to  3.3, replenished orally. Normal renal function, CK only 722, low concern for rhabdomyolysis requiring IVF resuscitation and inpatient management.   Wife: In PT for speech and ambulates with assistance or with a walker at best. Updated her on the results of diagnostic testing and overall plan of care.     Final diagnoses:  Fall, initial encounter    ED Discharge Orders     None          Jerrol Agent, MD 05/29/24 1415

## 2024-05-29 NOTE — ED Triage Notes (Signed)
 Pt arrives to ED via EMS from [facility], after staff noticed he had an unwitnessed fall out of bed, unknown how long he was on the floor. Per EMS by the time they arrived staff had the patient in a wheelchair.

## 2024-06-11 ENCOUNTER — Telehealth: Payer: Self-pay | Admitting: *Deleted

## 2024-06-11 NOTE — Telephone Encounter (Signed)
 Copied from CRM #[id number]. Topic: Clinical - Medical Advice >> Jun 11, 2024  2:57 PM Marda MATSU wrote: [place] with [facility] ask:  1. Patient would like to know if Dr V would be his hospice provider. 2. If you do agree do you want to manage your own comfort care orders 3. Do you feel that the patient has 6 mos left of life expectancy if his terminal illness runs its normal course.   Please advise

## 2024-06-13 ENCOUNTER — Telehealth: Payer: Self-pay

## 2024-06-13 ENCOUNTER — Ambulatory Visit: Admitting: Sports Medicine

## 2024-06-13 NOTE — Telephone Encounter (Signed)
 Pleasen advise. Routed to [name], MD   Copied from CRM [id number]. Topic: General - Other >> Jun 13, 2024  9:26 AM Diannia H wrote: Reason for CRM: Patient was discharged from hospice at facility and transferred to hospice at home and the patients family would like to know if Dr. LULLA would be okay being the patients hospice provider. Star from [facility] can be reached at [phone] for any additional questions or concerns.

## 2024-06-13 NOTE — Telephone Encounter (Deleted)
 CRM # [id number] Owner: Elnor Foy T, CMA Status: Resolved Open  Priority: Routine Created on: 06/13/2024 09:26 AM By: Vinita Diannia FALCON   Primary Information  Source  [name], Cody Contreras (Patient)   Subject  Cody Contreras, Cody Contreras (Patient)   Topic  General - Other     Communication  Reason for CRM: Patient was discharged from hospice at facility and transferred to hospice at home and the patients family would like to know if Dr. LULLA would be okay being the patients hospice provider. Star from [facility] can be reached at [phone] for any additional questions or concerns.

## 2024-06-14 ENCOUNTER — Telehealth: Payer: Self-pay

## 2024-06-14 NOTE — Telephone Encounter (Signed)
 [facility] has been notified.   [name], MD to [facility]     06/14/24 10:26 AM I would not serve as hospice provider for this patient.

## 2024-06-14 NOTE — Telephone Encounter (Signed)
 Copied from CRM [id number]. Topic: General - Other >> Jun 13, 2024  9:26 AM Diannia H wrote: Reason for CRM: Patient was discharged from hospice at facility and transferred to hospice at home and the patients family would like to know if Dr. LULLA would be okay being the patients hospice provider. Star from [facility] can be reached at [phone] for any additional questions or concerns. >> Jun 14, 2024 10:03 AM Susanna ORN wrote: Josette Kiang, with [facility], calling to get an update. She states the patient's family is requesting that Dr. Sherlynn to be the hospice attending provider. Advised that the message was routed to Dr. Sherlynn but she hasn't gotten to it yet. Kristen asked if this could be marked as a priority due to Medicare guidelines, they have to report if provider hasn't tended to the issue. Her call back number is [phone].

## 2024-06-20 ENCOUNTER — Ambulatory Visit: Admitting: Sports Medicine

## 2024-07-04 ENCOUNTER — Ambulatory Visit: Admitting: Family
